# Patient Record
Sex: Male | Born: 1965 | Race: White | Hispanic: No | Smoking: Former smoker
Health system: Southern US, Community
[De-identification: ages and names within clinical notes are randomized; demographics above are authoritative.]

## PROBLEM LIST (undated history)

## (undated) DIAGNOSIS — K219 Gastro-esophageal reflux disease without esophagitis: Secondary | ICD-10-CM

## (undated) DIAGNOSIS — M109 Gout, unspecified: Secondary | ICD-10-CM

## (undated) DIAGNOSIS — M545 Low back pain, unspecified: Secondary | ICD-10-CM

## (undated) DIAGNOSIS — G629 Polyneuropathy, unspecified: Secondary | ICD-10-CM

## (undated) DIAGNOSIS — R112 Nausea with vomiting, unspecified: Secondary | ICD-10-CM

## (undated) DIAGNOSIS — M5116 Intervertebral disc disorders with radiculopathy, lumbar region: Secondary | ICD-10-CM

## (undated) DIAGNOSIS — I10 Essential (primary) hypertension: Secondary | ICD-10-CM

## (undated) DIAGNOSIS — C642 Malignant neoplasm of left kidney, except renal pelvis: Secondary | ICD-10-CM

## (undated) DIAGNOSIS — E785 Hyperlipidemia, unspecified: Secondary | ICD-10-CM

## (undated) DIAGNOSIS — Z9889 Other specified postprocedural states: Secondary | ICD-10-CM

## (undated) DIAGNOSIS — F1010 Alcohol abuse, uncomplicated: Secondary | ICD-10-CM

## (undated) DIAGNOSIS — K769 Liver disease, unspecified: Secondary | ICD-10-CM

## (undated) HISTORY — DX: Malignant neoplasm of left kidney, except renal pelvis: C64.2

## (undated) HISTORY — PX: TONSILLECTOMY: SUR1361

## (undated) HISTORY — DX: Essential (primary) hypertension: I10

## (undated) HISTORY — DX: Intervertebral disc disorders with radiculopathy, lumbar region: M51.16

## (undated) HISTORY — DX: Gastro-esophageal reflux disease without esophagitis: K21.9

---

## 1994-04-22 HISTORY — PX: OTHER SURGICAL HISTORY: SHX169

## 2011-06-21 ENCOUNTER — Ambulatory Visit: Payer: Self-pay | Admitting: Internal Medicine

## 2011-07-17 ENCOUNTER — Inpatient Hospital Stay: Payer: Self-pay | Admitting: Specialist

## 2011-07-17 LAB — CBC
HCT: 40.8 % (ref 40.0–52.0)
HCT: 38.1 % — ABNORMAL LOW (ref 40.0–52.0)
MCH: 30.7 pg (ref 26.0–34.0)
MCV: 90 fL (ref 80–100)
Platelet: 39 10*3/uL — ABNORMAL LOW (ref 150–440)
Platelet: 33 10*3/uL — ABNORMAL LOW (ref 150–440)
RBC: 4.53 10*6/uL (ref 4.40–5.90)
WBC: 1.7 10*3/uL — CL (ref 3.8–10.6)

## 2011-07-17 LAB — URINALYSIS, COMPLETE
Bilirubin,UR: NEGATIVE
Leukocyte Esterase: NEGATIVE
Protein: 100
RBC,UR: <1 /HPF (ref 0–5)
Specific Gravity: 1.03 (ref 1.003–1.030)
WBC UR: 2 /HPF (ref 0–5)

## 2011-07-17 LAB — COMPREHENSIVE METABOLIC PANEL
Albumin: 4.6 g/dL (ref 3.4–5.0)
Alkaline Phosphatase: 81 U/L (ref 50–136)
BUN: 9 mg/dL (ref 7–18)
Calcium, Total: 9.4 mg/dL (ref 8.5–10.1)
Co2: 24 mmol/L (ref 21–32)
EGFR (Non-African Amer.): >60
Glucose: 105 mg/dL — ABNORMAL HIGH (ref 65–99)
Sodium: 138 mmol/L (ref 136–145)

## 2011-07-17 LAB — DIFFERENTIAL
Lymphocyte %: 29.5 %
Monocyte #: 0.2 10*3/uL (ref 0.0–0.7)
Neutrophil #: 1.2 10*3/uL — ABNORMAL LOW (ref 1.4–6.5)
Neutrophil %: 56.6 %

## 2011-07-17 LAB — ETHANOL: Ethanol: 266 mg/dL

## 2011-07-17 LAB — PROTIME-INR: Prothrombin Time: 13.3 secs (ref 11.5–14.7)

## 2011-07-18 LAB — CBC WITH DIFFERENTIAL/PLATELET
HGB: 13.6 g/dL (ref 13.0–18.0)
Lymphocyte #: 0.4 10*3/uL — ABNORMAL LOW (ref 1.0–3.6)
MCV: 91 fL (ref 80–100)
Monocyte #: 0.2 10*3/uL (ref 0.0–0.7)
Monocyte %: 10.2 %
RBC: 4.41 10*6/uL (ref 4.40–5.90)
RDW: 17.9 % — ABNORMAL HIGH (ref 11.5–14.5)
WBC: 2.2 10*3/uL — ABNORMAL LOW (ref 3.8–10.6)

## 2011-07-19 LAB — CBC WITH DIFFERENTIAL/PLATELET
Basophil %: 0 %
Eosinophil %: 0.7 %
Lymphocyte #: 0.2 10*3/uL — ABNORMAL LOW (ref 1.0–3.6)
MCV: 91 fL (ref 80–100)
Monocyte #: 0.2 10*3/uL (ref 0.0–0.7)
Monocyte %: 9.1 %
Neutrophil %: 81.1 %
RBC: 4.69 10*6/uL (ref 4.40–5.90)

## 2011-07-19 LAB — BASIC METABOLIC PANEL
BUN: 4 mg/dL — ABNORMAL LOW (ref 7–18)
Calcium, Total: 9.3 mg/dL (ref 8.5–10.1)
Co2: 25 mmol/L (ref 21–32)
Creatinine: 0.58 mg/dL — ABNORMAL LOW (ref 0.60–1.30)
EGFR (African American): >60
Glucose: 132 mg/dL — ABNORMAL HIGH (ref 65–99)

## 2011-07-19 LAB — MAGNESIUM: Magnesium: 1.3 mg/dL — ABNORMAL LOW

## 2011-07-19 LAB — PLATELET COUNT: Platelet: 45 10*3/uL — ABNORMAL LOW (ref 150–440)

## 2011-07-19 LAB — FOLATE: Folic Acid: 16.2 ng/mL (ref 3.1–100.0)

## 2011-07-20 LAB — CBC WITH DIFFERENTIAL/PLATELET
Basophil #: 0 x10 3/mm 3
Basophil %: 1.3 %
Eosinophil #: 0.1 x10 3/mm 3
Eosinophil %: 4.3 %
HCT: 39.2 % — ABNORMAL LOW
HGB: 13.3 g/dL
Lymphocyte %: 24.4 %
Lymphs Abs: 0.6 x10 3/mm 3 — ABNORMAL LOW
MCH: 31 pg
MCHC: 34.1 g/dL
MCV: 91 fL
Monocyte #: 0.3 x10 3/mm 3
Monocyte %: 11.5 %
Neutrophil #: 1.4 x10 3/mm 3
Neutrophil %: 58.5 %
Platelet: 43 x10 3/mm 3 — ABNORMAL LOW
RBC: 4.3 x10 6/mm 3 — ABNORMAL LOW
RDW: 18 % — ABNORMAL HIGH
WBC: 2.4 x10 3/mm 3 — ABNORMAL LOW

## 2011-07-21 LAB — CBC WITH DIFFERENTIAL/PLATELET
Basophil %: 1.4 %
Eosinophil #: 0.2 10*3/uL (ref 0.0–0.7)
Eosinophil %: 5.5 %
HCT: 40.8 % (ref 40.0–52.0)
Monocyte #: 0.5 10*3/uL (ref 0.0–0.7)
Neutrophil #: 2.3 10*3/uL (ref 1.4–6.5)
Platelet: 79 10*3/uL — ABNORMAL LOW (ref 150–440)
RBC: 4.4 10*6/uL (ref 4.40–5.90)
WBC: 3.8 10*3/uL (ref 3.8–10.6)

## 2011-08-21 ENCOUNTER — Ambulatory Visit: Payer: Self-pay | Admitting: Internal Medicine

## 2011-11-15 ENCOUNTER — Encounter (HOSPITAL_COMMUNITY): Payer: Self-pay | Admitting: Emergency Medicine

## 2011-11-15 ENCOUNTER — Emergency Department (HOSPITAL_COMMUNITY)
Admission: EM | Admit: 2011-11-15 | Discharge: 2011-11-15 | Disposition: A | Discharge status: Home / Self Care | Payer: Medicaid Other | Attending: Emergency Medicine | Admitting: Emergency Medicine

## 2011-11-15 DIAGNOSIS — Z87891 Personal history of nicotine dependence: Secondary | ICD-10-CM | POA: Insufficient documentation

## 2011-11-15 DIAGNOSIS — R000 Tachycardia, unspecified: Secondary | ICD-10-CM | POA: Insufficient documentation

## 2011-11-15 DIAGNOSIS — E876 Hypokalemia: Secondary | ICD-10-CM

## 2011-11-15 DIAGNOSIS — H6120 Impacted cerumen, unspecified ear: Secondary | ICD-10-CM | POA: Insufficient documentation

## 2011-11-15 DIAGNOSIS — I10 Essential (primary) hypertension: Secondary | ICD-10-CM | POA: Insufficient documentation

## 2011-11-15 DIAGNOSIS — K769 Liver disease, unspecified: Secondary | ICD-10-CM

## 2011-11-15 HISTORY — DX: Essential (primary) hypertension: I10

## 2011-11-15 LAB — COMPREHENSIVE METABOLIC PANEL
ALT: 156 U/L — ABNORMAL HIGH (ref 0–53)
AST: 149 U/L — ABNORMAL HIGH (ref 0–37)
Albumin: 4.5 g/dL (ref 3.5–5.2)
Calcium: 10.9 mg/dL — ABNORMAL HIGH (ref 8.4–10.5)
Sodium: 128 mEq/L — ABNORMAL LOW (ref 135–145)
Total Protein: 8.3 g/dL (ref 6.0–8.3)

## 2011-11-15 LAB — CBC WITH DIFFERENTIAL/PLATELET
Basophils Absolute: 0 10*3/uL (ref 0.0–0.1)
Eosinophils Absolute: 0 10*3/uL (ref 0.0–0.7)
Hemoglobin: 16.1 g/dL (ref 13.0–17.0)
Lymphocytes Relative: 17 % (ref 12–46)
MCHC: 36 g/dL (ref 30.0–36.0)
Monocytes Relative: 12 % (ref 3–12)
Neutrophils Relative %: 69 % (ref 43–77)
RDW: 14.9 % (ref 11.5–15.5)
Smear Review: DECREASED
WBC: 3.4 10*3/uL — ABNORMAL LOW (ref 4.0–10.5)

## 2011-11-15 MED ORDER — POTASSIUM CHLORIDE CRYS ER 20 MEQ PO TBCR
40.0000 meq | EXTENDED_RELEASE_TABLET | Freq: Once | ORAL | Status: AC
  Administered 2011-11-15: 40 meq via ORAL
  Filled 2011-11-15: qty 2

## 2011-11-15 MED ORDER — METOPROLOL TARTRATE 50 MG PO TABS
ORAL_TABLET | ORAL | Status: AC
  Administered 2011-11-15: 50 mg
  Filled 2011-11-15: qty 1

## 2011-11-15 MED ORDER — POTASSIUM CHLORIDE ER 10 MEQ PO TBCR: EXTENDED_RELEASE_TABLET | ORAL | Status: DC

## 2011-11-15 MED ORDER — SODIUM CHLORIDE 0.9 % IV SOLN
INTRAVENOUS | Status: DC
  Administered 2011-11-15: 02:00:00 via INTRAVENOUS

## 2011-11-15 MED ORDER — SPIRONOLACTONE 25 MG PO TABS: 25.0000 mg | ORAL_TABLET | Freq: Every day | ORAL | Status: DC

## 2011-11-15 MED ORDER — NEOMYCIN-POLYMYXIN-HC 3.5-10000-1 OT SOLN
OTIC | Status: AC
  Administered 2011-11-15: 3 [drp] via OTIC
  Filled 2011-11-15: qty 10

## 2011-11-15 MED ORDER — BENAZEPRIL HCL 20 MG PO TABS: 10.0000 mg | ORAL_TABLET | Freq: Every day | ORAL | Status: DC

## 2011-11-15 NOTE — ED Notes (Signed)
[facility] aware of altered vital signs, ok to discharge home.

## 2011-11-15 NOTE — ED Provider Notes (Signed)
0210 Patient here with c/o change in hearing. Had cerumen impaction bilaterally. 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 Labs with elevated LFTs. Does not have  PCP. Hypertensive and tachycardia. Last PCP was [facility] who released him. He has been evaluated at both [address] and [facility]. Both have advised he had liver disease and recommended stopping alcohol.  HEENT: slight icterus of conjunctiva which are injected. Cor: Tachycardia, regular rhythm, no murmur, click or rub noted Lungs: clear all fields, no rales, rhonchi, wheezing Abd: Slightly large liver, non tender, no fluid wave Ext: no edema.  Plan of care is to begin antihypertensive, Rx for potassium, Rx for spironolactone. Encouraged to find a PCP. Encouraged to stop drinking.  Nicoletta Dress. Colon Branch, MD 11/15/11 [phone]

## 2011-11-15 NOTE — ED Notes (Addendum)
Patient states that he had vomiting several days ago and has had sensitive hearing since then.  Pt is very sensitive to the lightest sound and jumps at every motion in exam room.  Severe tremor noted. Pt states that he does not drink everyday or use any type of illegal drugs.  Jaundice noted to bilateral eyes.

## 2011-11-15 NOTE — ED Notes (Addendum)
When asked if the patient was in pain, he initially states "no" and then states "pain? Oh yes, in my back and in my legs and numbness in my feet."  Patient does not currently see a doctor.  States it has been several months since he has seen a doctor for anything.  Pt states he is supposed to take several medications but does not do so, because he is unable to afford them at this time.  States he has been off his medications since May 2013.   Patient states he doesn't know what he has been diagnosed with in the past except for high blood pressure.  Pt reassured and advised to seek resources to help him pay for his medications and medical bills.

## 2011-11-15 NOTE — ED Provider Notes (Signed)
Medical screening examination/treatment/procedure(s) were conducted as a shared visit with non-physician practitioner(s) and myself.  I personally evaluated the patient during the encounter  Juan Jackson. Colon Branch, MD 11/15/11 [phone]

## 2011-11-15 NOTE — ED Notes (Signed)
States ears are feeling somewhat better.

## 2011-11-15 NOTE — ED Notes (Signed)
Patient states had some nausea, vomiting on Wednesday and now c/o bilateral ear pain.  States everything sounds loud and he states he can hear muffled sounds when he talks.

## 2011-11-15 NOTE — ED Provider Notes (Signed)
History     CSN: [id number]  Arrival date & time 11/15/11  0006   First MD Initiated Contact with Patient 11/15/11 0022      Chief Complaint  Patient presents with  . Otalgia    (Consider location/radiation/quality/duration/timing/severity/associated sxs/prior treatment) HPI Comments: Patient is a 46 year old male who presents to the emergency department with complaint of pain in both ears. The patient states that he became nauseated and then had 2-3 episodes of vomiting on Wednesday, July 24. He states that following this episode he began to have problems hearing and having some pain in both ears. This is gotten worse today, as he feels as though he hears everything as a" muffled voice or muffled sound". He states he has not had any injury to the ears. He has not been exposed to any excessively loud sounds. He's not had any previous operations or procedures on the ears. He has not been doing any swimming. He has only taken BuSpar during the day today  The history is provided by the patient.    Past Medical History  Diagnosis Date  . Hypertension     History reviewed. No pertinent past surgical history.  No family history on file.  History  Substance Use Topics  . Smoking status: Former Games developer  . Smokeless tobacco: Not on file  . Alcohol Use: Yes     occ      Review of Systems  Constitutional: Negative for activity change.       All ROS Neg except as noted in HPI  HENT: Negative for nosebleeds and neck pain.   Eyes: Negative for photophobia and discharge.  Respiratory: Negative for cough, shortness of breath and wheezing.   Cardiovascular: Negative for chest pain and palpitations.  Gastrointestinal: Positive for abdominal pain. Negative for blood in stool.  Genitourinary: Negative for dysuria, frequency and hematuria.  Musculoskeletal: Negative for back pain and arthralgias.  Skin: Negative.   Neurological: Negative for dizziness, seizures and speech difficulty.    Psychiatric/Behavioral: Negative for hallucinations and confusion. The patient is nervous/anxious.     Allergies  Review of patient's allergies indicates no known allergies.  Home Medications  No current outpatient prescriptions on file.  BP 188/126  Pulse 140  Temp 98.2 F (36.8 C) (Oral)  Ht 6\' 2"  (1.88 m)  Wt 205 lb (92.987 kg)  BMI 26.32 kg/m2  SpO2 98%  Physical Exam  Nursing note and vitals reviewed. Constitutional: He is oriented to person, place, and time. He appears well-developed and well-nourished.  Non-toxic appearance.  HENT:  Head: Normocephalic.  Right Ear: Tympanic membrane and external ear normal.  Left Ear: Tympanic membrane and external ear normal.       There are bilateral cerumen impactions present. Nasal congestion.  Eyes: EOM and lids are normal. Pupils are equal, round, and reactive to light.       There is increased redness and some icterus of the conjunctiva.  Neck: Normal range of motion. Neck supple. Carotid bruit is not present.  Cardiovascular: Regular rhythm, normal heart sounds, intact distal pulses and normal pulses.  Tachycardia present.   Pulmonary/Chest: Breath sounds normal. No respiratory distress.  Abdominal: Soft. Bowel sounds are normal. There is tenderness. There is no guarding.       There is right upper quadrant tenderness to palpation. Patient states this is not new. States" my liver is messed up".  Musculoskeletal: Normal range of motion.  Lymphadenopathy:       Head (right side): No submandibular  adenopathy present.       Head (left side): No submandibular adenopathy present.    He has no cervical adenopathy.  Neurological: He is alert and oriented to person, place, and time. He has normal strength. No cranial nerve deficit or sensory deficit.  Skin: Skin is warm and dry.  Psychiatric: His speech is normal. His mood appears anxious.    ED Course  Procedures (including critical care time)Bilat cerumen impaction partially  removed with warm tap water. Accidentally scratched the left EAC during procedure. Ear wick placed in the left ear. Cortisporin otic 3 gtts placed in right and left ear.   Labs Reviewed  CBC WITH DIFFERENTIAL  COMPREHENSIVE METABOLIC PANEL  AMMONIA  EKG 0:32 - rate-120 beats per minute. Rhythm-sinus tachycardia. PR interval within normal limits. Axis-normal. QRS-within normal limits. ST-within normal limits.No STEMI. No previous EKG to compare. No results found.   No diagnosis found.    MDM  I have reviewed nursing notes, vital signs, and all appropriate lab and imaging results for this patient. Heart rate was up to 138. Down to 120 at 01:23.  Pt admits to drinking Vodka on Friday 7/19 and Sunday 7/21. He has been told not to drink due to liver problems. Ammonia 33 - wnl. The AST is elevated at 149. The ALT is elevated at 156. Total Bili is 2. Potassium is 3.3.  Pt seen with me by Dr Colon Branch. Pt given list of foods high in potassium. Spironolactone and Lotensin given. Pt to call Dr Darrick Penna for assistance wth liver disease. [facility] for assistance with his high blood pressure. Pt advised to stop using ETOH.  Heart rate down to 110. Pt awake and alert in no distress.  Kathie Dike, [place] 11/15/11 [phone]

## 2011-11-15 NOTE — ED Notes (Signed)
Pt seems somewhat calmer, tremor continues in bilateral hands.

## 2011-11-18 ENCOUNTER — Encounter (HOSPITAL_COMMUNITY): Payer: Self-pay | Admitting: *Deleted

## 2011-11-18 ENCOUNTER — Emergency Department (HOSPITAL_COMMUNITY)
Admission: EM | Admit: 2011-11-18 | Discharge: 2011-11-18 | Disposition: A | Discharge status: Home / Self Care | Payer: Medicaid Other | Attending: Emergency Medicine | Admitting: Emergency Medicine

## 2011-11-18 DIAGNOSIS — M109 Gout, unspecified: Secondary | ICD-10-CM | POA: Insufficient documentation

## 2011-11-18 DIAGNOSIS — R319 Hematuria, unspecified: Secondary | ICD-10-CM | POA: Insufficient documentation

## 2011-11-18 DIAGNOSIS — R748 Abnormal levels of other serum enzymes: Secondary | ICD-10-CM

## 2011-11-18 DIAGNOSIS — F1010 Alcohol abuse, uncomplicated: Secondary | ICD-10-CM | POA: Insufficient documentation

## 2011-11-18 DIAGNOSIS — I10 Essential (primary) hypertension: Secondary | ICD-10-CM | POA: Insufficient documentation

## 2011-11-18 DIAGNOSIS — D696 Thrombocytopenia, unspecified: Secondary | ICD-10-CM

## 2011-11-18 DIAGNOSIS — Z87891 Personal history of nicotine dependence: Secondary | ICD-10-CM | POA: Insufficient documentation

## 2011-11-18 HISTORY — DX: Gout, unspecified: M10.9

## 2011-11-18 LAB — URINALYSIS, ROUTINE W REFLEX MICROSCOPIC
Glucose, UA: NEGATIVE mg/dL
Leukocytes, UA: NEGATIVE
Specific Gravity, Urine: 1.025 (ref 1.005–1.030)
pH: 6 (ref 5.0–8.0)

## 2011-11-18 LAB — HEPATIC FUNCTION PANEL
ALT: 98 U/L — ABNORMAL HIGH (ref 0–53)
AST: 63 U/L — ABNORMAL HIGH (ref 0–37)
Albumin: 3.9 g/dL (ref 3.5–5.2)
Alkaline Phosphatase: 57 U/L (ref 39–117)
Total Bilirubin: 1.7 mg/dL — ABNORMAL HIGH (ref 0.3–1.2)

## 2011-11-18 LAB — BASIC METABOLIC PANEL
CO2: 26 mEq/L (ref 19–32)
Calcium: 10.3 mg/dL (ref 8.4–10.5)
Chloride: 96 mEq/L (ref 96–112)
Sodium: 134 mEq/L — ABNORMAL LOW (ref 135–145)

## 2011-11-18 LAB — URIC ACID: Uric Acid, Serum: 6.7 mg/dL (ref 4.0–7.8)

## 2011-11-18 LAB — URINE MICROSCOPIC-ADD ON

## 2011-11-18 LAB — CBC WITH DIFFERENTIAL/PLATELET
Basophils Absolute: 0 10*3/uL (ref 0.0–0.1)
HCT: 38.9 % — ABNORMAL LOW (ref 39.0–52.0)
Lymphocytes Relative: 14 % (ref 12–46)
Monocytes Relative: 27 % — ABNORMAL HIGH (ref 3–12)
Neutro Abs: 2.8 10*3/uL (ref 1.7–7.7)
RDW: 15.4 % (ref 11.5–15.5)
WBC: 4.8 10*3/uL (ref 4.0–10.5)

## 2011-11-18 MED ORDER — KETOROLAC TROMETHAMINE 60 MG/2ML IM SOLN
60.0000 mg | Freq: Once | INTRAMUSCULAR | Status: AC
  Administered 2011-11-18: 60 mg via INTRAMUSCULAR
  Filled 2011-11-18: qty 2

## 2011-11-18 MED ORDER — INDOMETHACIN 50 MG PO CAPS: ORAL_CAPSULE | ORAL | Status: DC

## 2011-11-18 MED ORDER — HYDROCODONE-ACETAMINOPHEN 5-325 MG PO TABS: 2.0000 | ORAL_TABLET | Freq: Four times a day (QID) | ORAL | Status: AC | PRN

## 2011-11-18 MED ORDER — ATENOLOL 50 MG PO TABS: 50.0000 mg | ORAL_TABLET | Freq: Every day | ORAL | Status: DC

## 2011-11-18 NOTE — ED Notes (Signed)
Pt alert and oriented x 3. Skin warm and dry. Color pink. Breath sounds clear and equal bilaterally. Pt has redness and swelling to his left great toe and ankle. Pt states that the B/P meds have caused his gout to flare up. Pt also c/o hematuria since this am. Denies abdominal or flank pain. Denies problems with urination.

## 2011-11-18 NOTE — ED Provider Notes (Cosign Needed)
History  This chart was scribed for Ward Givens, MD by Bennett Scrape. This patient was seen in room APA05/APA05 and the patient's care was started at 2:04PM.  CSN: [id number]  Arrival date & time 11/18/11  1322   First MD Initiated Contact with Patient 11/18/11 1404      Chief Complaint  Patient presents with  . Hematuria  . Gout    Patient is a 46 y.o. male presenting with hematuria. The history is provided by the patient. No language interpreter was used.  Hematuria This is a new problem. The current episode started today. The problem is unchanged. The hematuria occurs throughout his entire urinary stream. He reports no clotting in his urine stream. He describes his urine color as tea colored. Irritative symptoms do not include frequency or urgency. Pertinent negatives include no abdominal pain, chills, dysuria, fever, flank pain, nausea or vomiting.    Juan Jackson is a 46 y.o. male who presents to the Emergency Department complaining of 12 hours of sudden onset, constant hematuria described as tea colored. Pt reports two episodes today. He reports prior episodes attributed to a non-specific liver problem which he states he needed blood transfusions and platelet transfusions for. He also c/o gout in his left foot attributed to benazepril and spironolactone that he was started on 4 days ago. He reports that he has a h/o gout but has not had a flare up in 2 years. He states that he hasn't been taking his gout or his HTN medications for 2 years after his PCP stopped them 2 years ago due to the HTN medication causing the gout flare ups. He denies abdominal pain, nausea, emesis, fever and new back pain as associated symptoms. He reports that he stopped drinking alcohol one week ago but states that he was consuming up to half a gallon of alcohol per week before that.   Past Medical History  Diagnosis Date  . Hypertension   . Gout-46 years of age     Past Surgical History  Procedure  Date  . Tonsillectomy     No family history on file.  History  Substance Use Topics  . Smoking status: Former Games developer  . Smokeless tobacco: Not on file  . Alcohol Use: No  filing for disability     Review of Systems  Constitutional: Negative for fever and chills.  Gastrointestinal: Negative for nausea, vomiting, abdominal pain and diarrhea.  Genitourinary: Positive for hematuria. Negative for dysuria, urgency, frequency and flank pain.  Musculoskeletal: Positive for back pain (chronic).       Left foot pain and swelling  Neurological: Positive for weakness. Negative for dizziness.  All other systems reviewed and are negative.    Allergies  Review of patient's allergies indicates no known allergies.  Home Medications   Current Outpatient Rx  Name Route Sig Dispense Refill  . BENAZEPRIL HCL 20 MG PO TABS Oral Take 0.5 tablets (10 mg total) by mouth daily. 30 tablet 0  . SPIRONOLACTONE 25 MG PO TABS Oral Take 1 tablet (25 mg total) by mouth daily. 30 tablet 0  . TETRAHYDROZOLINE HCL 0.05 % OP SOLN Both Eyes Place 1 drop into both eyes as needed. For allergy eyes      Triage Vitals: Blood pressure 116/78,Pulse 120  Temp 99 F (37.2 C) (Oral)  Resp 20  Ht 6\' 2"  (1.88 m)  Wt 205 lb (92.987 kg)  BMI 26.32 kg/m2  SpO2 100%  Vital signs normal except tachycardia.  Physical Exam  Nursing note and vitals reviewed. Constitutional: He is oriented to person, place, and time. He appears well-developed and well-nourished. No distress.  HENT:  Head: Normocephalic and atraumatic.  Right Ear: External ear normal.  Left Ear: External ear normal.  Mouth/Throat: Oropharynx is clear and moist.       Mild tongue tremor  Eyes: Conjunctivae and EOM are normal. Pupils are equal, round, and reactive to light. No scleral icterus.  Neck: Normal range of motion. Neck supple. No tracheal deviation present.  Cardiovascular: Regular rhythm.  Tachycardia present.  Exam reveals no gallop  and no friction rub.   No murmur heard. Pulmonary/Chest: Effort normal and breath sounds normal. No respiratory distress.  Abdominal: Soft. There is no tenderness.  Musculoskeletal: Normal range of motion. He exhibits edema and tenderness.       Diffuse swelling and warmth of left foot, erythema over the MTP of the left great toe, erythema over lateral and medial malleolae c/w gout  Neurological: He is alert and oriented to person, place, and time.  Skin: Skin is warm and dry.  Psychiatric: He has a normal mood and affect. His behavior is normal.    ED Course  Procedures (including critical care time)   Medications  ketorolac (TORADOL) injection 60 mg (60 mg Intramuscular Given 11/18/11 1459)    DIAGNOSTIC STUDIES: Oxygen Saturation is 100% on room air, normal by my interpretation.    COORDINATION OF CARE: 2:50PM-Discussed treatment plan which includes blood work, urinalysis and gout medication with pt at bedside and pt agreed to plan.  3:48PM-Pt rechecked and states that symptoms are improved. Informed pt of lab results. Discussed discharge plan with pt and pt agreed.   Results for orders placed during the hospital encounter of 11/18/11  URINALYSIS, ROUTINE W REFLEX MICROSCOPIC      Component Value Range   Color, Urine YELLOW  YELLOW   APPearance CLEAR  CLEAR   Specific Gravity, Urine 1.025  1.005 - 1.030   pH 6.0  5.0 - 8.0   Glucose, UA NEGATIVE  NEGATIVE mg/dL   Hgb urine dipstick NEGATIVE  NEGATIVE   Bilirubin Urine MODERATE (*) NEGATIVE   Ketones, ur NEGATIVE  NEGATIVE mg/dL   Protein, ur TRACE (*) NEGATIVE mg/dL   Urobilinogen, UA 0.2  0.0 - 1.0 mg/dL   Nitrite POSITIVE (*) NEGATIVE   Leukocytes, UA NEGATIVE  NEGATIVE  BASIC METABOLIC PANEL      Component Value Range   Sodium 134 (*) 135 - 145 mEq/L   Potassium 3.8  3.5 - 5.1 mEq/L   Chloride 96  96 - 112 mEq/L   CO2 26  19 - 32 mEq/L   Glucose, Bld 103 (*) 70 - 99 mg/dL   BUN 13  6 - 23 mg/dL   Creatinine,  Ser 1.61  0.50 - 1.35 mg/dL   Calcium 09.6  8.4 - 04.5 mg/dL   GFR calc non Af Amer >90  >90 mL/min   GFR calc Af Amer >90  >90 mL/min  URIC ACID      Component Value Range   Uric Acid, Serum 6.7  4.0 - 7.8 mg/dL  CBC WITH DIFFERENTIAL      Component Value Range   WBC 4.8  4.0 - 10.5 K/uL   RBC 4.47  4.22 - 5.81 MIL/uL   Hemoglobin 13.5  13.0 - 17.0 g/dL   HCT 40.9 (*) 81.1 - 91.4 %   MCV 87.0  78.0 - 100.0 fL   MCH  30.2  26.0 - 34.0 pg   MCHC 34.7  30.0 - 36.0 g/dL   RDW 16.1  09.6 - 04.5 %   Platelets 83 (*) 150 - 400 K/uL   Neutrophils Relative 58  43 - 77 %   Lymphocytes Relative 14  12 - 46 %   Monocytes Relative 27 (*) 3 - 12 %   Eosinophils Relative 0  0 - 5 %   Basophils Relative 1  0 - 1 %   Neutro Abs 2.8  1.7 - 7.7 K/uL   Lymphs Abs 0.7  0.7 - 4.0 K/uL   Monocytes Absolute 1.3 (*) 0.1 - 1.0 K/uL   Eosinophils Absolute 0.0  0.0 - 0.7 K/uL   Basophils Absolute 0.0  0.0 - 0.1 K/uL   WBC Morphology ATYPICAL LYMPHOCYTES     Smear Review PLATELET COUNT CONFIRMED BY SMEAR    URINE MICROSCOPIC-ADD ON      Component Value Range   Squamous Epithelial / LPF RARE  RARE   WBC, UA 0-2  <3 WBC/hpf   Bacteria, UA FEW (*) RARE   Urine-Other MUCOUS PRESENT    APTT      Component Value Range   aPTT 30  24 - 37 seconds  PROTIME-INR      Component Value Range   Prothrombin Time 13.9  11.6 - 15.2 seconds   INR 1.05  0.00 - 1.49  HEPATIC FUNCTION PANEL      Component Value Range   Total Protein 7.9  6.0 - 8.3 g/dL   Albumin 3.9  3.5 - 5.2 g/dL   AST 63 (*) 0 - 37 U/L   ALT 98 (*) 0 - 53 U/L   Alkaline Phosphatase 57  39 - 117 U/L   Total Bilirubin 1.7 (*) 0.3 - 1.2 mg/dL   Bilirubin, Direct 0.3  0.0 - 0.3 mg/dL   Indirect Bilirubin 1.4 (*) 0.3 - 0.9 mg/dL   Laboratory interpretation all normal except improving elevations of LFT's, improving thrombocytopenia, bilirubinuria   1. Acute gouty arthropathy   2. Alcohol abuse   3. Abnormal liver enzymes   4. Thrombocytopenia      New Prescriptions   ATENOLOL (TENORMIN) 50 MG TABLET    Take 1 tablet (50 mg total) by mouth daily.   HYDROCODONE-ACETAMINOPHEN (NORCO/VICODIN) 5-325 MG PER TABLET    Take 2 tablets by mouth every 6 (six) hours as needed for pain.   INDOMETHACIN (INDOCIN) 50 MG CAPSULE    Take 1 po QID x 3d , then 1 po TID x 3d then 1 po BID x 3d, then 1 po QD x 3d    Plan discharge  Devoria Albe, MD, FACEP   MDM    I personally performed the services described in this documentation, which was scribed in my presence. The recorded information has been reviewed and considered.  Devoria Albe, MD, Armando Gang    Ward Givens, MD 11/18/11 [phone]

## 2011-11-18 NOTE — ED Notes (Signed)
Pt states he was here Thursday night and placed on Benazepril and Spironolactone. Pt states these meds have caused gout to left foot. States prior BP med caused gout once before also. Pt also states he noticed blood in his urine this morning. NAD.

## 2011-12-26 ENCOUNTER — Emergency Department (HOSPITAL_COMMUNITY): Payer: Medicaid Other

## 2011-12-26 ENCOUNTER — Encounter (HOSPITAL_COMMUNITY): Payer: Self-pay | Admitting: *Deleted

## 2011-12-26 ENCOUNTER — Emergency Department (HOSPITAL_COMMUNITY)
Admission: EM | Admit: 2011-12-26 | Discharge: 2011-12-26 | Disposition: A | Discharge status: Home / Self Care | Payer: Medicaid Other | Attending: Emergency Medicine | Admitting: Emergency Medicine

## 2011-12-26 DIAGNOSIS — Z87891 Personal history of nicotine dependence: Secondary | ICD-10-CM | POA: Insufficient documentation

## 2011-12-26 DIAGNOSIS — S52599A Other fractures of lower end of unspecified radius, initial encounter for closed fracture: Secondary | ICD-10-CM | POA: Insufficient documentation

## 2011-12-26 DIAGNOSIS — S52501A Unspecified fracture of the lower end of right radius, initial encounter for closed fracture: Secondary | ICD-10-CM

## 2011-12-26 DIAGNOSIS — Y93H3 Activity, building and construction: Secondary | ICD-10-CM | POA: Insufficient documentation

## 2011-12-26 DIAGNOSIS — K769 Liver disease, unspecified: Secondary | ICD-10-CM | POA: Insufficient documentation

## 2011-12-26 DIAGNOSIS — I10 Essential (primary) hypertension: Secondary | ICD-10-CM | POA: Insufficient documentation

## 2011-12-26 DIAGNOSIS — W138XXA Fall from, out of or through other building or structure, initial encounter: Secondary | ICD-10-CM | POA: Insufficient documentation

## 2011-12-26 DIAGNOSIS — W19XXXA Unspecified fall, initial encounter: Secondary | ICD-10-CM

## 2011-12-26 HISTORY — DX: Low back pain, unspecified: M54.50

## 2011-12-26 HISTORY — DX: Liver disease, unspecified: K76.9

## 2011-12-26 HISTORY — DX: Alcohol abuse, uncomplicated: F10.10

## 2011-12-26 HISTORY — DX: Low back pain: M54.5

## 2011-12-26 HISTORY — DX: Polyneuropathy, unspecified: G62.9

## 2011-12-26 MED ORDER — OXYCODONE-ACETAMINOPHEN 5-325 MG PO TABS
1.0000 | ORAL_TABLET | Freq: Once | ORAL | Status: AC
  Administered 2011-12-26: 1 via ORAL
  Filled 2011-12-26: qty 1

## 2011-12-26 MED ORDER — OXYCODONE-ACETAMINOPHEN 5-325 MG PO TABS: 1.0000 | ORAL_TABLET | ORAL | Status: AC | PRN

## 2011-12-26 NOTE — ED Notes (Signed)
Patient fell of roof approx. 13-14 feet. R arm pain.

## 2011-12-26 NOTE — ED Provider Notes (Signed)
History     CSN: [id number]  Arrival date & time 12/26/11  1343   First MD Initiated Contact with Patient 12/26/11 1402      Chief Complaint  Patient presents with  . Arm Pain    (Consider location/radiation/quality/duration/timing/severity/associated sxs/prior treatment) HPI Comments: Patient is a 46 year old man who was power washing a roof prior to applying a sealer to it. He slipped and fell, falling 14 feet onto concrete. He was not unconscious. There was no bleeding. He notes pain in his right shoulder, right wrist. He therefore came to [facility] ED for evaluation.  Patient is a 46 y.o. male presenting with fall. The history is provided by the patient. No language interpreter was used.  Fall The accident occurred less than 1 hour ago. The fall occurred from a roof. He fell from a height of 11 to 15 ft. He landed on concrete. There was no blood loss. The point of impact was the right shoulder and right wrist. The pain is severe. He was ambulatory at the scene. There was no entrapment after the fall. There was no drug use involved in the accident. There was no alcohol use involved in the accident. Pertinent negatives include no fever, no numbness, no abdominal pain, no nausea, no vomiting and no tingling. The symptoms are aggravated by use of the injured limb. Prehospitalization: Splint to right forearm. He has tried immobilization for the symptoms. The treatment provided mild relief.    Past Medical History  Diagnosis Date  . Hypertension   . Gout   . Liver disease, chronic   . ETOH abuse   . Lumbar back pain   . Peripheral neuropathy     Past Surgical History  Procedure Date  . Tonsillectomy     Family History  Problem Relation Age of Onset  . Cancer Mother   . Diabetes Mother   . Coronary artery disease Mother   . Coronary artery disease Father   . Hypertension Father   . Diabetes Father   . Diabetes Sister   . Diabetes Brother   . Cirrhosis Brother   . Diabetes  Sister   . Thyroid disease Sister     History  Substance Use Topics  . Smoking status: Former Smoker    Quit date: 12/22/1991  . Smokeless tobacco: Not on file  . Alcohol Use: No      Review of Systems  Constitutional: Negative.  Negative for fever and chills.  HENT: Negative.   Eyes: Negative.   Respiratory: Negative.   Cardiovascular: Negative.   Gastrointestinal: Negative.  Negative for nausea, vomiting and abdominal pain.  Genitourinary: Negative.   Musculoskeletal:       Injury to right shoulder and right forearm.  Neurological: Negative.  Negative for tingling and numbness.  Psychiatric/Behavioral: Negative.     Allergies  Morphine and related  Home Medications   Current Outpatient Rx  Name Route Sig Dispense Refill  . ATENOLOL 50 MG PO TABS Oral Take 1 tablet (50 mg total) by mouth daily. 30 tablet 0  . BENAZEPRIL HCL 20 MG PO TABS Oral Take 0.5 tablets (10 mg total) by mouth daily. 30 tablet 0  . INDOMETHACIN 50 MG PO CAPS  Take 1 po QID x 3d , then 1 po TID x 3d then 1 po BID x 3d, then 1 po QD x 3d 30 capsule 0  . SPIRONOLACTONE 25 MG PO TABS Oral Take 1 tablet (25 mg total) by mouth daily. 30 tablet 0  .  TETRAHYDROZOLINE HCL 0.05 % OP SOLN Both Eyes Place 1 drop into both eyes as needed. For allergy eyes      BP 118/83  Pulse 68  Temp 98 F (36.7 C) (Oral)  Resp 18  Ht 6\' 2"  (1.88 m)  Wt 220 lb (99.791 kg)  BMI 28.25 kg/m2  SpO2 98%  Physical Exam  Nursing note and vitals reviewed. Constitutional: He is oriented to person, place, and time. He appears well-developed and well-nourished.       In moderate distress with pain in the right arm.  HENT:  Head: Normocephalic and atraumatic.  Right Ear: External ear normal.  Left Ear: External ear normal.  Mouth/Throat: Oropharynx is clear and moist.  Eyes: Conjunctivae and EOM are normal. Pupils are equal, round, and reactive to light.  Neck: Normal range of motion. Neck supple.       No deformity of  the neck, no muscle spasm, full range of motion of the neck.  Cardiovascular: Normal rate, regular rhythm and normal heart sounds.   Pulmonary/Chest: Effort normal and breath sounds normal. He exhibits no tenderness.  Abdominal: Soft. Bowel sounds are normal. He exhibits no distension. There is no tenderness.  Musculoskeletal:       He has pain over the rotator cuff of the right arm and over the mid right forearm. He has intact pulses sensation and tendon function in the right hand.  Neurological: He is alert and oriented to person, place, and time.       No sensory or motor deficit.  Skin: Skin is warm and dry.  Psychiatric: He has a normal mood and affect. His behavior is normal.    ED Course  Procedures (including critical care time)  2:14 PM Pt seen --> physical exam performed. PO pain medicine ordered.  X-rays ordered.  4:21 PM X-rays showed comminuted fracture of right distal radius.  Other x-rays were negative.  Case discussed by telephone with Dr. Hilda Lias, orthopedist on call, who advised a sugartong splint, sling, pain medication, and office followup tomorrow.   1. Fall   2. Fracture of right distal radius       Carleene Cooper III, MD 12/26/11 1622

## 2011-12-26 NOTE — ED Notes (Signed)
Patient with no complaints at this time. Respirations even and unlabored. Skin warm/dry. Discharge instructions reviewed with patient at this time. Patient given opportunity to voice concerns/ask questions. Patient discharged at this time and left Emergency Department with steady gait.   

## 2011-12-26 NOTE — ED Notes (Signed)
3" ortho-glass splint placed on patients right arm. Sugar tong splint. With arm sling. Patient tolerated well and voiced no concerns.

## 2013-08-06 IMAGING — CR DG CERVICAL SPINE COMPLETE 4+V
6 series · 6 of 6 positions shown · non-contrast
Comparison: None available.

CLINICAL DATA: Fall.  Arm pain.

CERVICAL SPINE - COMPLETE 4+ VIEW

[view not recorded (1 of 6)]
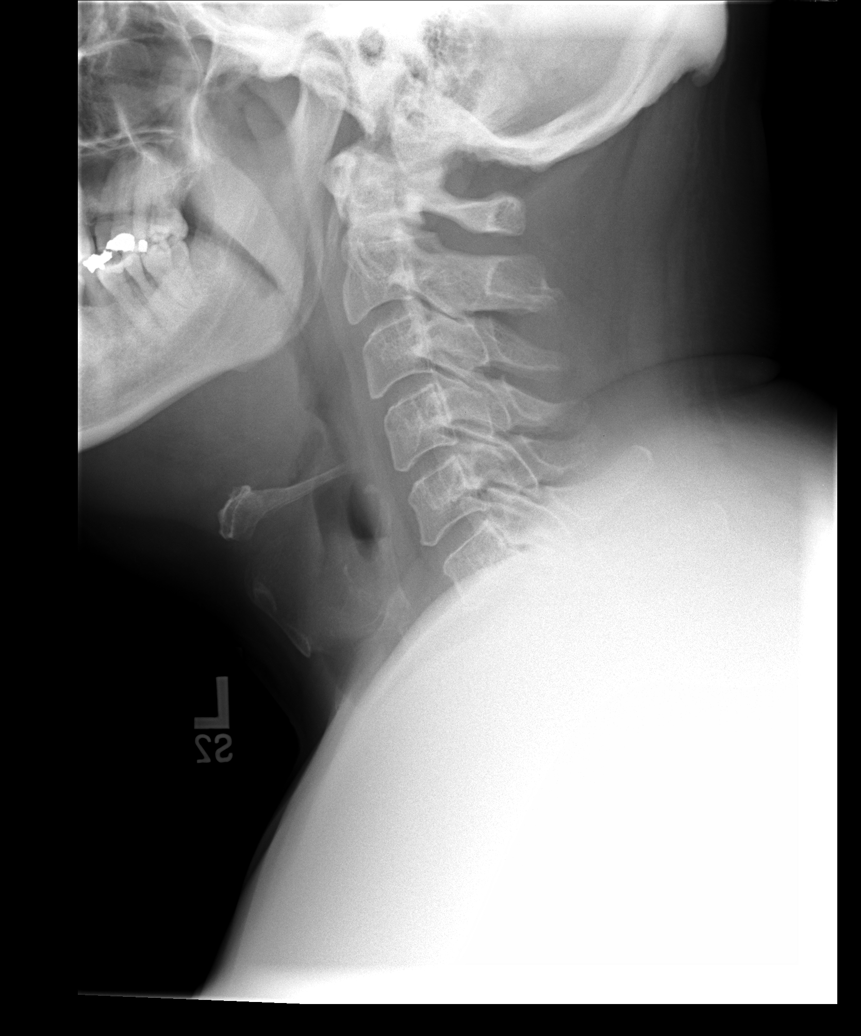

[view not recorded (2 of 6)]
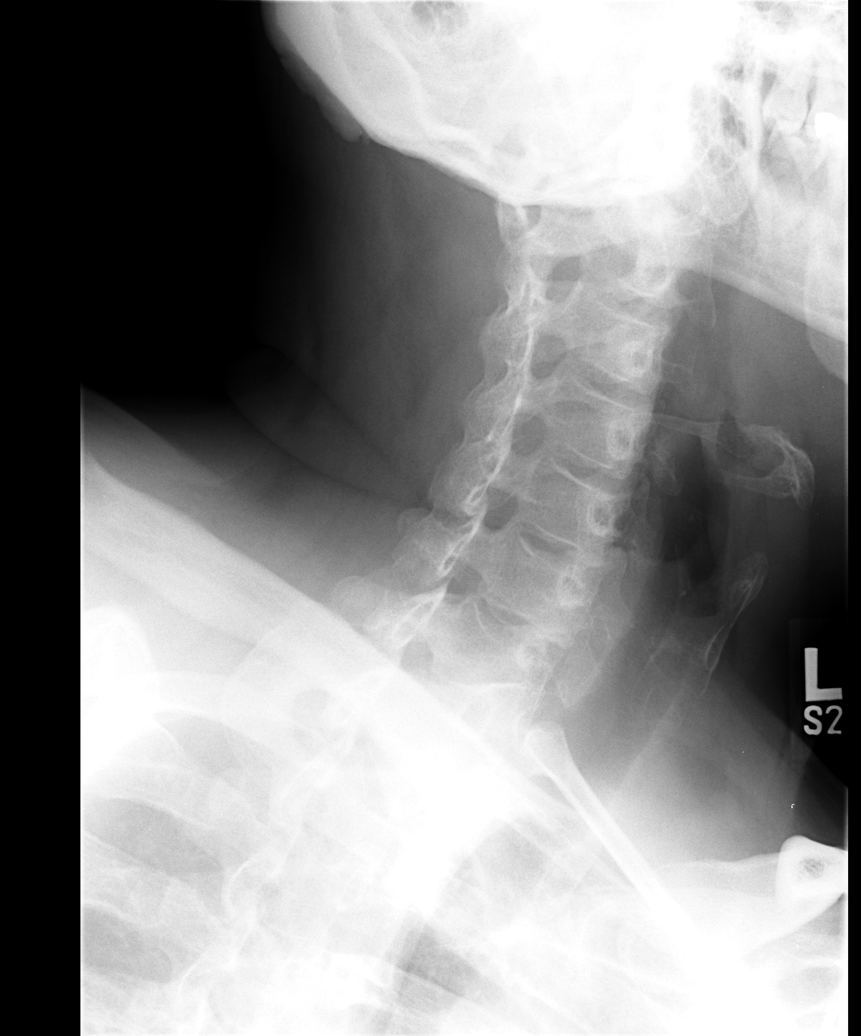

[view not recorded (3 of 6)]
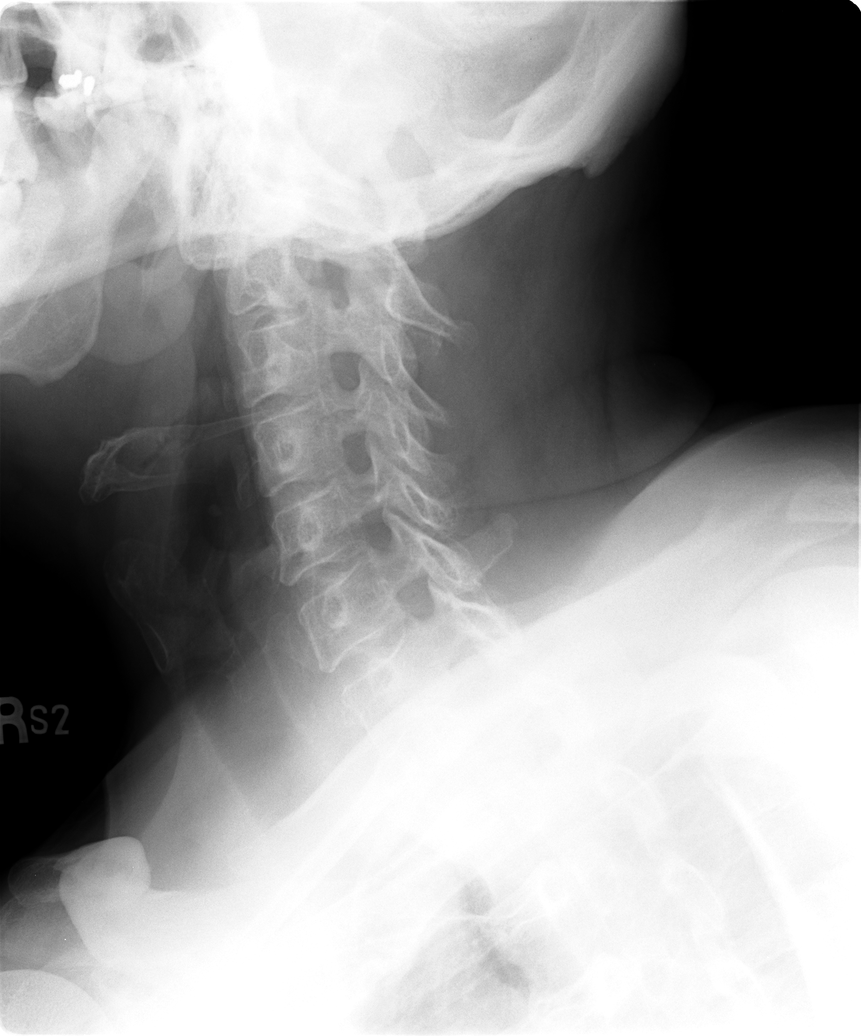

[view not recorded (4 of 6)]
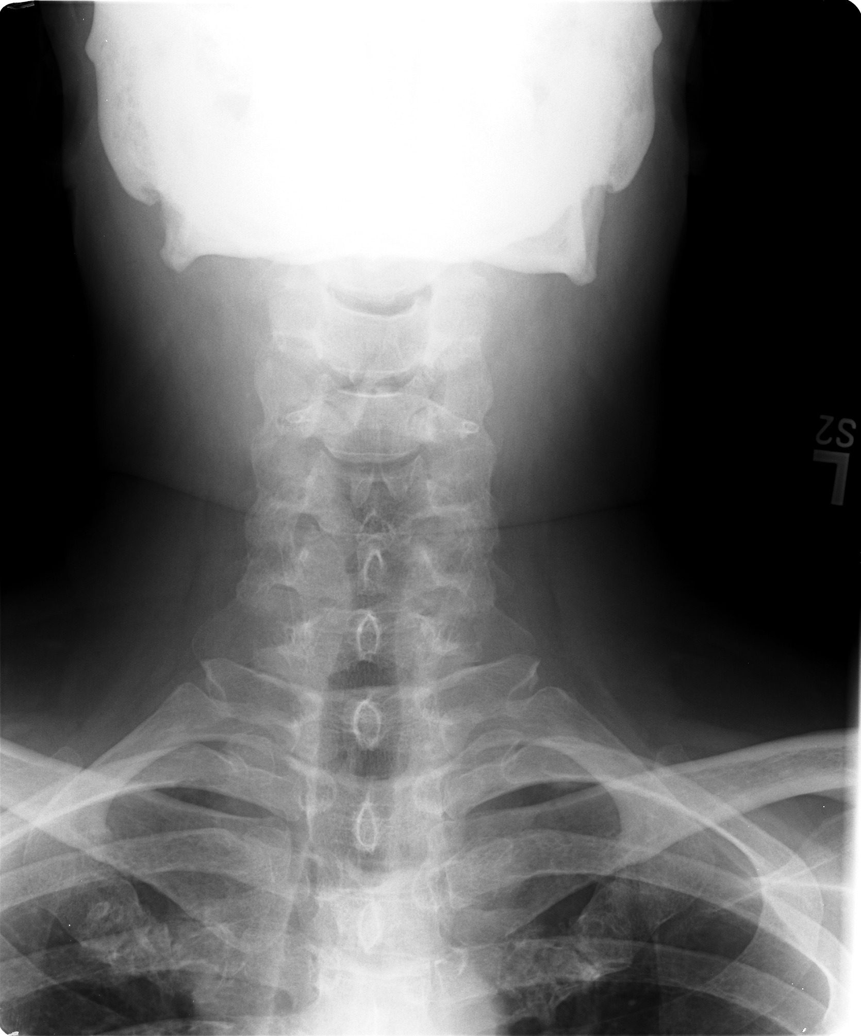

[view not recorded (5 of 6)]
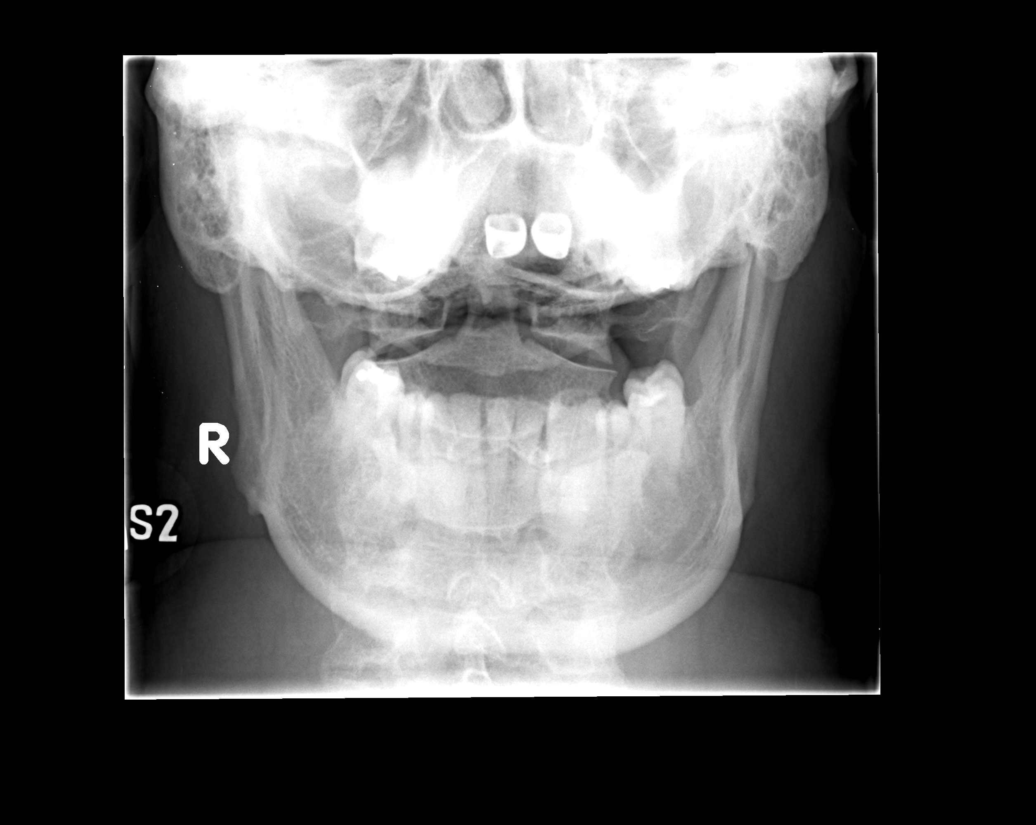

[view not recorded (6 of 6)]
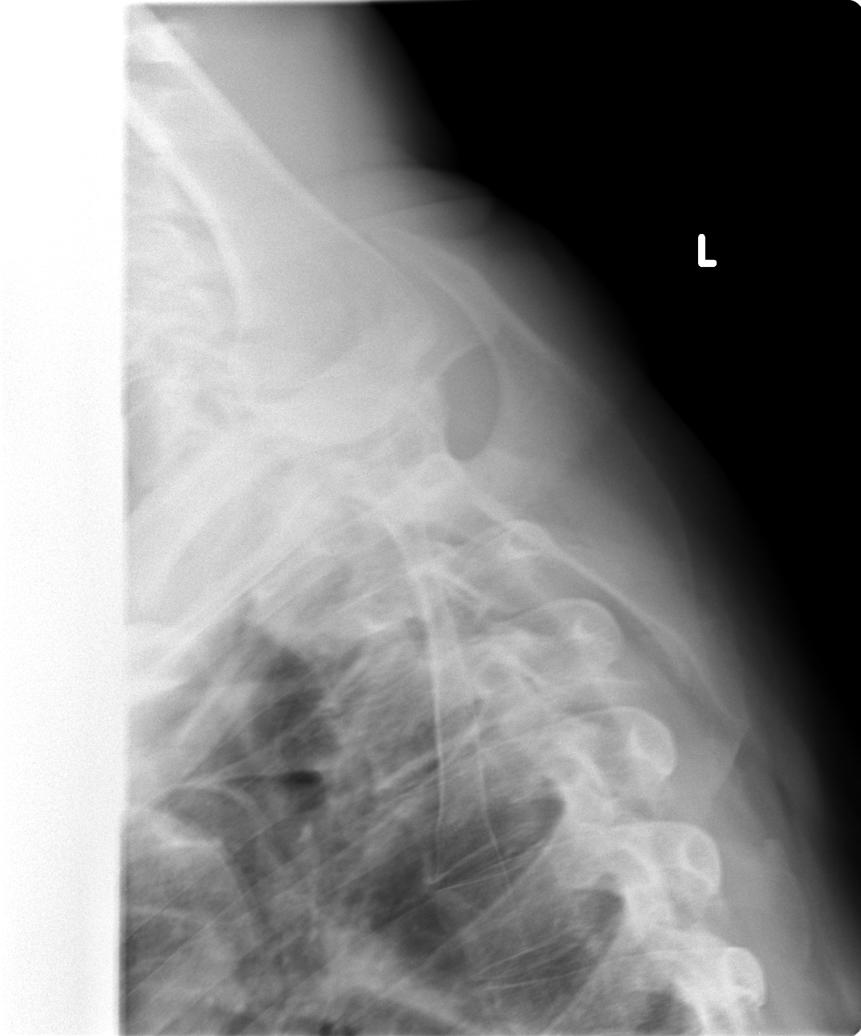

[6 of 6 positions shown; findings below may reference images not displayed]

FINDINGS: The cervical spine is visualized from skull base through
C6.  The prevertebral soft tissues are within normal limits.
Alignment is anatomic.  The C7 the cervicothoracic junction are
incompletely visualized.  There is significant motion on the
swimmer's view.  The AP and oblique views are within normal limits.
The lung apices are clear.
IMPRESSION: 1.  No acute abnormality of the cervical spine.
2.  The cervicothoracic junction is incompletely visualized on the
lateral and swimmer's views.

## 2013-08-06 IMAGING — CR DG WRIST COMPLETE 3+V*R*
3 series · 3 of 3 positions shown · non-contrast
Comparison: None.

CLINICAL DATA: Fall.  Right forearm and wrist pain.

RIGHT WRIST - COMPLETE 3+ VIEW

[view not recorded (1 of 3)]
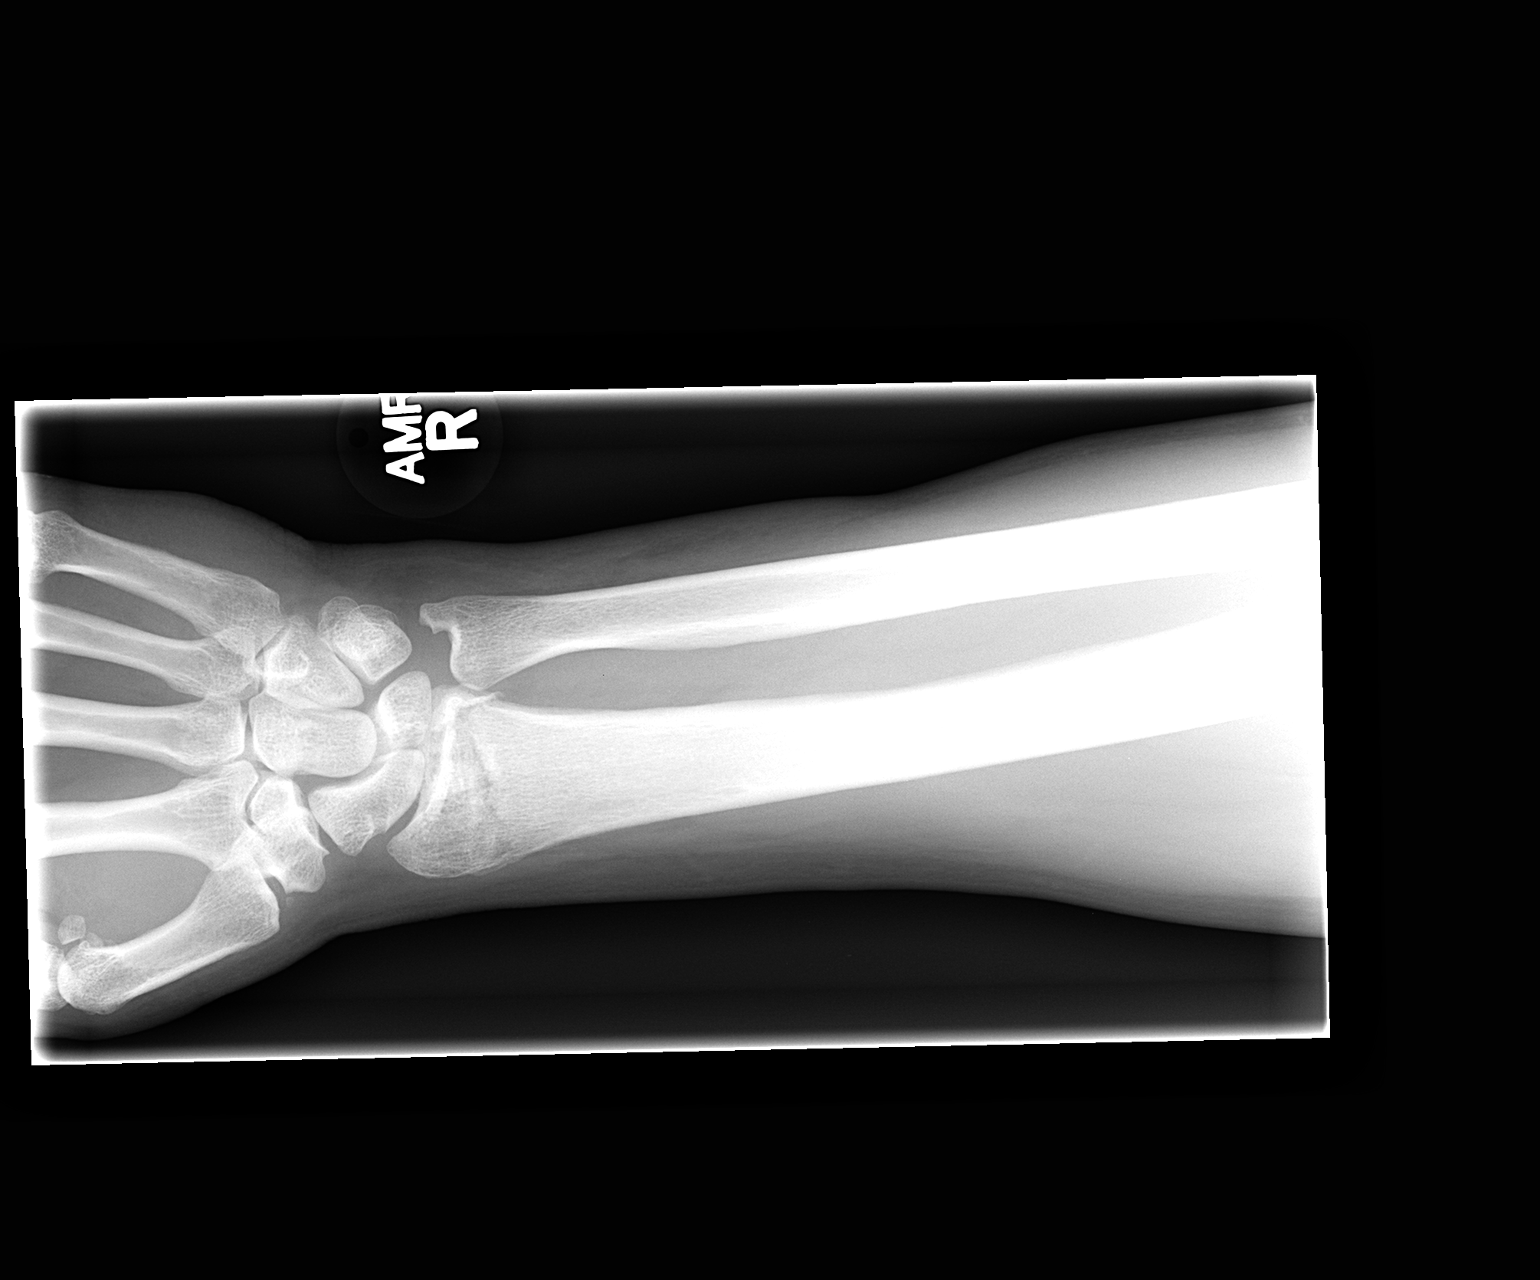

[view not recorded (2 of 3)]
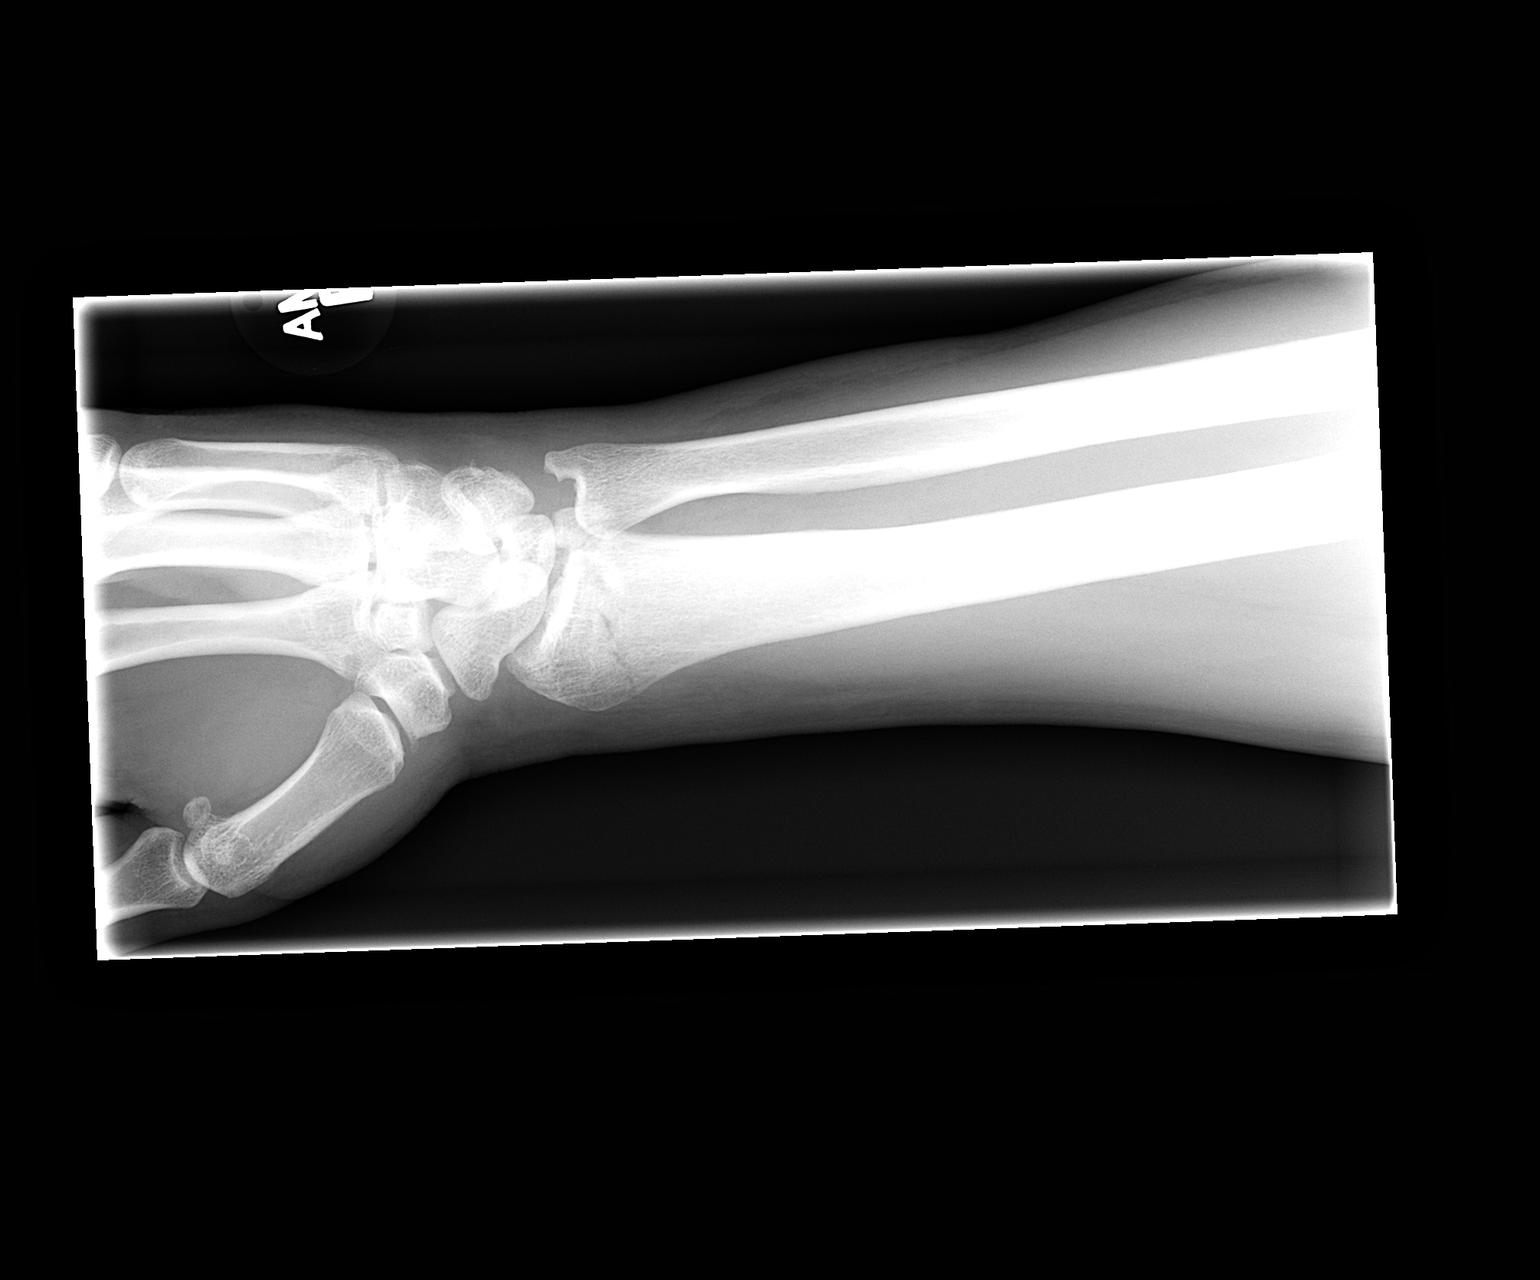

[view not recorded (3 of 3)]
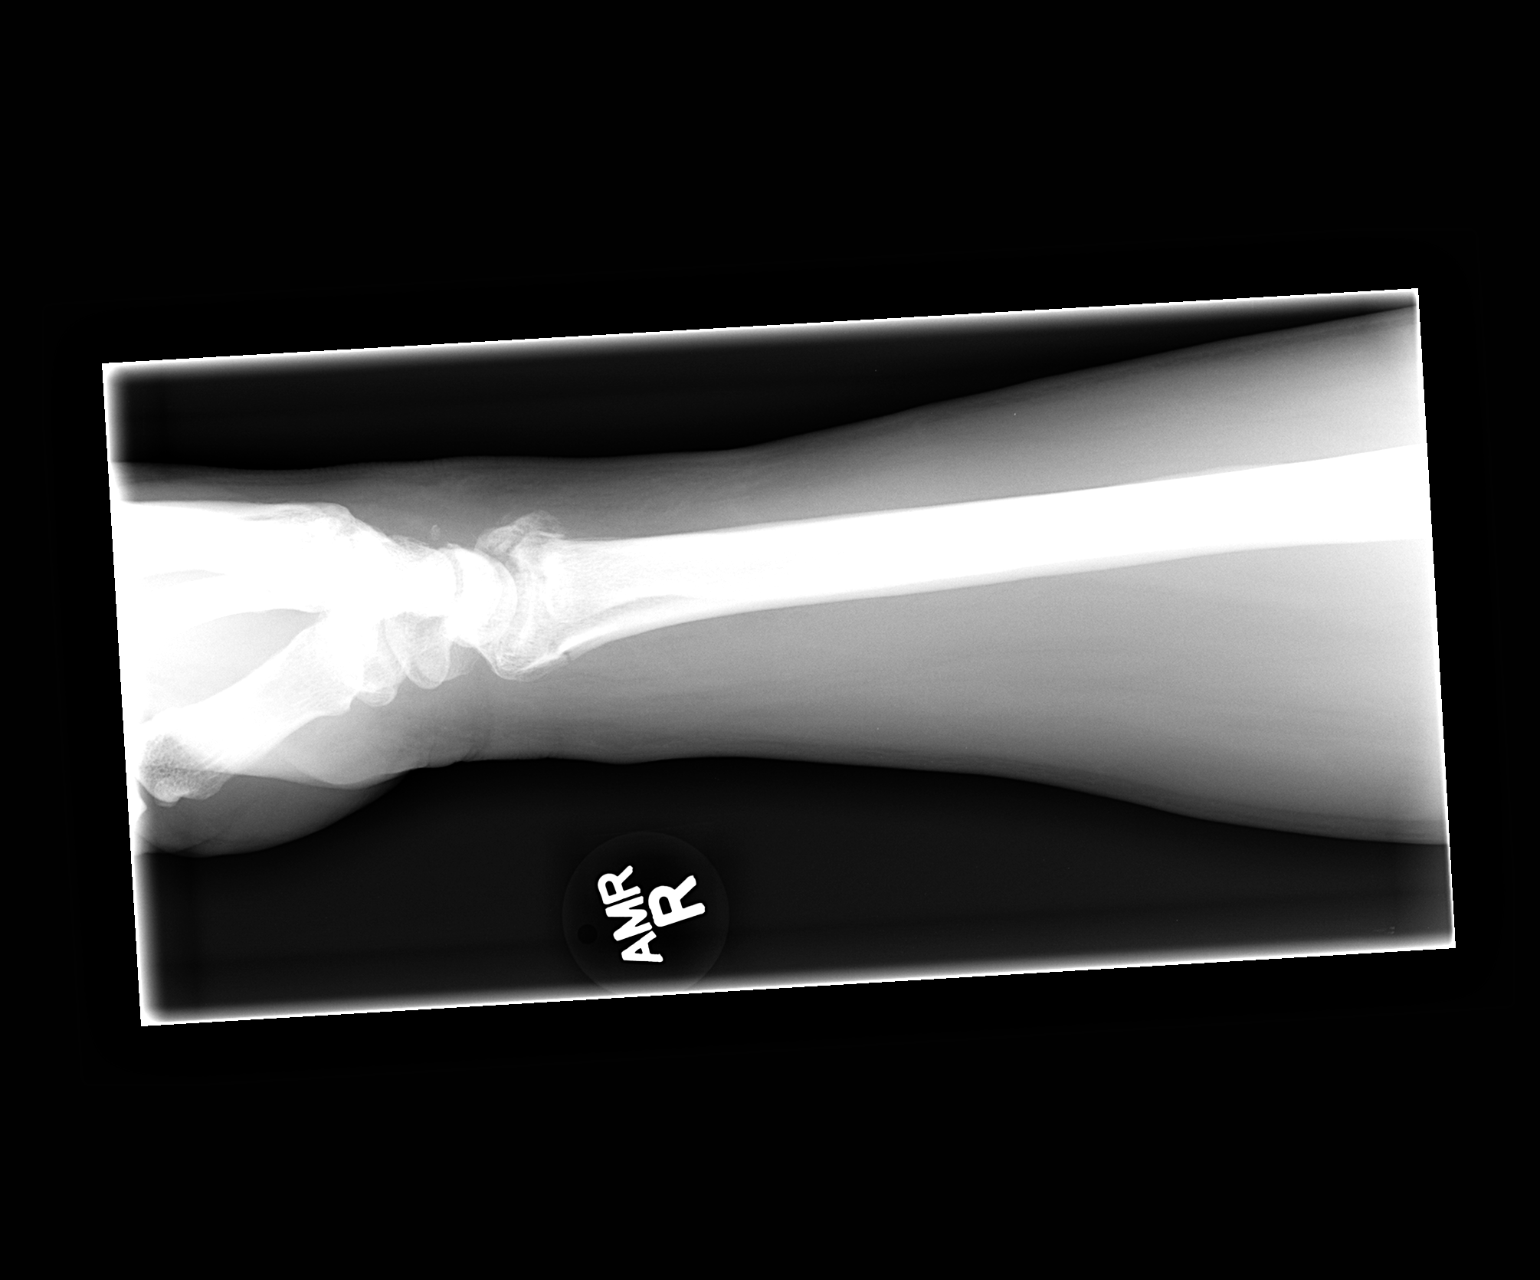

[3 of 3 positions shown; findings below may reference images not displayed]

FINDINGS: A comminuted fracture of the distal radius extends to the
joint surface.  The wrist is located.   A dorsal bone fragment is
most compatible with a triquetral fracture.
IMPRESSION: 1.  Comminuted intra-articular distal radial fracture.
2.  Small displaced posterior triquetral fracture.

## 2013-11-17 DIAGNOSIS — K219 Gastro-esophageal reflux disease without esophagitis: Secondary | ICD-10-CM

## 2013-11-17 DIAGNOSIS — I10 Essential (primary) hypertension: Secondary | ICD-10-CM

## 2013-11-17 DIAGNOSIS — E785 Hyperlipidemia, unspecified: Secondary | ICD-10-CM | POA: Insufficient documentation

## 2013-11-17 DIAGNOSIS — M109 Gout, unspecified: Secondary | ICD-10-CM | POA: Insufficient documentation

## 2013-11-17 HISTORY — DX: Gastro-esophageal reflux disease without esophagitis: K21.9

## 2013-11-17 HISTORY — DX: Essential (primary) hypertension: I10

## 2014-01-12 DIAGNOSIS — K219 Gastro-esophageal reflux disease without esophagitis: Secondary | ICD-10-CM

## 2014-01-12 DIAGNOSIS — G8929 Other chronic pain: Secondary | ICD-10-CM | POA: Insufficient documentation

## 2014-01-12 DIAGNOSIS — M25569 Pain in unspecified knee: Secondary | ICD-10-CM | POA: Insufficient documentation

## 2014-01-12 DIAGNOSIS — M545 Low back pain, unspecified: Secondary | ICD-10-CM | POA: Insufficient documentation

## 2014-01-12 HISTORY — DX: Gastro-esophageal reflux disease without esophagitis: K21.9

## 2014-02-03 DIAGNOSIS — M5116 Intervertebral disc disorders with radiculopathy, lumbar region: Secondary | ICD-10-CM | POA: Insufficient documentation

## 2014-02-03 HISTORY — DX: Intervertebral disc disorders with radiculopathy, lumbar region: M51.16

## 2014-02-15 ENCOUNTER — Ambulatory Visit: Payer: Self-pay | Admitting: Physical Medicine and Rehabilitation

## 2014-02-28 DIAGNOSIS — M5136 Other intervertebral disc degeneration, lumbar region: Secondary | ICD-10-CM | POA: Insufficient documentation

## 2014-06-08 DIAGNOSIS — M109 Gout, unspecified: Secondary | ICD-10-CM | POA: Insufficient documentation

## 2014-08-14 NOTE — Consult Note (Signed)
HEMATOLOGY followup  - denies bleeding symptoms. Bright red blood in stools is better.no fever.  Appetite is steadypatient is alert and oriented, in no acute distress. No pallor            vitals - 98, 75, 20, 133/88, 98% on room air           Lungs - bilateral good air entry           Abdomen - soft, nontender. hemoglobin 13.5, WBC 3800, ANC 2300, platelets 79K, INR 1.0.  PTT and folate unremarkable.  B12, hepatitis panel, HIV pending.  Recent CT of abdomen/pelvis showed fatty liver, no splenomegaly. mild Leukopenia - likely secondary to myelosuppressive effect of alcohol abuse versus other etiology.  CT scan and ultrasound shows fatty liver but no splenomegaly.  Hematochezia has improved, platelet count is beginning to improve. Patient being discharged home today. Will notify Dr.Gittin about this and to make followup appt for him to see patient at cancer center if he feels the need to. Patient explained above, he is agreeable to this plan.  Electronic Signatures: Jonn Shingles (MD)  (Signed on 31-Mar-13 23:24)  Authored  Last Updated: 31-Mar-13 23:24 by Jonn Shingles (MD)

## 2014-08-14 NOTE — Discharge Summary (Signed)
PATIENT NAME:  Juan Jackson, Juan Jackson MR#:  [id number] DATE OF BIRTH:  [birth year]  DATE OF ADMISSION:  07/17/2011 DATE OF DISCHARGE:  07/21/2011  For a detailed note, please take a look at the history and physical done by Dr. Lenore Manner on admission.   [place] COURSE:  1. Dr. Gaylyn Cheers from Gastroenterology   2. Dr. Barbette Reichmann from Hematology/Oncology    DIET: The patient is being discharged on a regular diet.   ACTIVITY: As tolerated.   FOLLOW-UP: Follow-up is with the [facility].   DISCHARGE MEDICATIONS:  1. Prilosec 20 mg b.i.d.  2. Nadolol 40 mg daily.  3. Tramadol 50 mg q.6 hours as needed.  4. Neurontin 100 mg b.i.d.   DIAGNOSES AT DISCHARGE:  1. Gastrointestinal bleed likely secondary to hemorrhoids.  2. Thrombocytopenia due to bone marrow suppression from heavy alcohol abuse.  3. Alcohol abuse.  4. Abnormal liver function tests secondary to fatty liver disease.  5. Chronic pain likely related to arthritis.  6. Supraventricular tachycardia.   PERTINENT STUDIES DONE DURING THE HOSPITAL COURSE ARE AS FOLLOWS:  1. CT scan of the abdomen and pelvis done with contrast on admission showing no evidence of any acute diverticulitis, no evidence of bowel obstruction or ileus, decreased density diffusely within the liver consistent with fatty infiltration. No evidence of hepatobiliary abnormality.  2. Abdominal ultrasound, limited, showing fatty infiltration of the liver.   HOSPITAL COURSE: The patient is a 49 year old male with medical problems as mentioned above who presented to the hospital with multiple episodes of bright red blood per rectum and also thrombocytopenia.  1. GI bleed. The patient had multiple episodes of bright red blood per rectum prior to coming into the hospital and was severely thrombocytopenic. A GI consult was obtained on admission. The patient was seen by Dr. Vira Agar and also by Dr. Gustavo Lah. As per their impression, his bleeding  was probably related to hemorrhoidal bleeding but has gotten worse because his platelet count was low. The patient was observed in the hospital for a few days and had serial hemoglobins drawn which did not get any worse. He remained hemodynamically stable. Initially was just on a liquid diet, slowly advanced to a regular diet and has been able to tolerate without evidence of abdominal pain or any problems with dropping of his hemoglobin. There was some concern that given his history of chronic alcohol abuse he may have some underlying esophageal varices but he did not have any melanotic stools or any hematemesis. He was although discharged on proton pump inhibitors b.i.d. along with nadolol as mentioned.  2. Leukopenia and thrombocytopenia. The patient presented to the hospital with platelet count as low as 39,000. He did receive 1 unit of platelet transfusion during the hospital. His platelet count on day of discharge is 79,000. As mentioned, he did not have any further evidence of bleeding during the hospital course. A Hematology/Oncology consult was obtained and, as per their evaluation, they thought that a lot of his symptoms and laboratory abnormalities were related to alcohol abuse and bone marrow suppression. They thought if he would abstain from alcohol his counts would improve. His counts did improve after some platelet transfusions as mentioned. The patient is to follow-up with Dr. Inez Pilgrim if needed as outpatient at the [facility]. His leukopenia has also since then resolved.  3. Abnormal liver function tests. This was probably secondary to alcohol abuse and fatty infiltration. This should improve as he continues to abstain from alcohol.  4. Chronic pain. The patient was discharged on some tramadol as needed but he has chronic pain issues and likely needs to get himself established with a primary care physician or with a pain management consult as an outpatient. 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  5. Supraventricular tachycardia. The patient did develop some episodes of SVT during the hospital course. He was given one dose of IV Cardizem which seemed to alleviate his SVT, although after being started on nadolol for prophylaxis from esophageal varices he has had no further episodes and will resume that.   CODE STATUS: The patient is a FULL CODE.   TIME SPENT: 40 minutes.  ____________________________ Belia Heman. Verdell Carmine, MD vjs:drc D: 07/21/2011 14:34:12 ET T: 07/23/2011 11:11:15 ET JOB#: [id number]  cc: Belia Heman. Verdell Carmine, MD, <Dictator> [facility] Henreitta Leber MD ELECTRONICALLY SIGNED 07/23/2011 15:00

## 2014-08-14 NOTE — H&P (Signed)
PATIENT NAME:  Juan Jackson, Juan Jackson MR#:  [id number] DATE OF BIRTH:  [birth year]  DATE OF ADMISSION:  07/17/2011  PRIMARY CARE PHYSICIAN: The patient has no primary care physician.   CHIEF COMPLAINT: Bright red blood per rectum on and off x4 days.   HISTORY OF PRESENT ILLNESS: Juan Jackson is a 49 year old Caucasian male with history of chronic alcoholism. He was in his usual state of health until about four days ago when he started to see on and off bright red blood per rectum, sometimes with clots. He states that today he had some bleeding with urination as well, but that was self-limited. He also describes mild dizziness. He was evaluated here at the emergency department and the findings are consistent with mild anemia, severe thrombocytopenia, and orthostatic blood pressure variation. The patient was admitted for further evaluation and management.  REVIEW OF SYSTEMS: CONSTITUTIONAL: Denies any fever. No chills. No night sweats. No fatigue. EYES: No blurring of vision. No double vision. ENT: No hearing impairment. No sore throat. No dysphagia. CARDIOVASCULAR: No chest pain. No shortness of breath. No syncope. RESPIRATORY: No cough. No sputum production. No chest pain. No shortness of breath. GASTROINTESTINAL: No abdominal pain apart from mildly crampy feeling of the periumbilical area. No vomiting. No diarrhea. He reports rectal bleeding as above. GENITOURINARY: No dysuria. No frequency of urination. He reported one episode of blood with the urine earlier this morning. MUSCULOSKELETAL: No joint pain or swelling, other than his chronic back pain. No muscular pain or swelling. INTEGUMENTARY: No skin rash. No ulcers. NEUROLOGY: No focal weakness. No seizure activity. No headache. He has chronic numbness in the left leg. PSYCHIATRY: He has mild anxiety. No depression. ENDOCRINE: No polyuria or polydipsia. No heat or cold intolerance.   PAST MEDICAL HISTORY:  1. History of chronic back pain stating this is the  reason why he is drinking alcohol to ease off his pain.  2. Numbness in his left leg. This is chronic for him.   FAMILY HISTORY: His father had hypertension and diabetes and he died from complications of chronic alcoholism. There is no family history of liver cirrhosis.   SOCIAL HABITS: He drinks alcohol every day. The amount is about one fifth. He drinks [employer] He states that he started drinking three years ago. No smoking currently. He has remote history of smoking, but he quit 15 years ago. No other drug abuse.   SOCIAL HISTORY: He is unemployed and he does not follow with any physician.   ADMISSION MEDICATIONS: He does not recall the names. He has three pills. One is an anti-inflammatory, the other is for numbness, and the third one he takes to calm his nerves.  ALLERGIES: No known drug allergies.   PHYSICAL EXAMINATION:   VITAL SIGNS: Blood pressure 168/100, respiratory rate 20, pulse 93, temperature 97.7, and oxygen saturation 99%.   GENERAL APPEARANCE: Young male sitting at the bedside in no acute distress.   HEAD AND NECK EXAMINATION: No pallor. No icterus. No cyanosis.  EARS, NOSE, AND THROAT:  Hearing was normal. Nasal mucosa, lips, and tongue were normal.   EYES: Normal iris and conjunctivae. Pupils are about 5 mm, equal and reactive to light.   NECK: Supple. Trachea at midline. No thyromegaly. No cervical lymphadenopathy. No masses.   HEART: Normal S1 and S2. No S3 or S4. No murmur. No gallop. No carotid bruits.   LUNGS: Normal breathing pattern without use of accessory muscles. No rales. No wheezing.   ABDOMEN: Soft without tenderness.  No hepatosplenomegaly. No masses. No hernias.   SKIN: No ulcers. No subcutaneous nodules.   MUSCULOSKELETAL: No joint swelling. No clubbing.   NEUROLOGIC: Cranial nerves II through XII are intact. No focal motor deficit.   PSYCHIATRIC: The patient is alert and oriented x3. Mood and affect were normal.   LABS/STUDIES: He had  a CAT scan of the abdomen and pelvis done in the emergency department and that showed no acute intraperitoneal inflammation or any bowel obstruction or any mass.   CBC showed a white count of 1700, hemoglobin 13, hematocrit 38, platelet count 33,000, MCV 90, and MCH and MCHC were normal. Serum glucose 105, BUN 9, creatinine 0.5, sodium 138, and potassium 3.6. Alcohol level 0.26. Liver function tests showed a total protein of 8.5, albumin 4.6, bilirubin 1.9, alkaline phosphatase 81, AST 202, and ALT 136. Prothrombin time 13. INR 1. APTT 29.  Urinalysis is unremarkable. White blood cells were 2 and RBC's were only 1 or less.   IMPRESSION:  1. Rectal bleeding, unsure if this is a lower gastrointestinal bleed or upper gastrointestinal bleed with rapid transit. 2. Severe thrombocytopenia, likely from his alcoholism and possible hypersplenism.  3. Mild anemia.  4. Orthostatic changes. His pulse in the lying position was 96 and standing was 136. There is not much blood pressure variation.  5. Alcoholic hepatitis.  6. Chronic alcoholism. 7. Chronic back pain.  8. Peripheral neuropathy.  PLAN: We will admit the patient to telemetry. Check hemoglobin every 8 hours. Stool for guaiac was brown, but heme positive. I started him on IV Protonix. Keep the patient n.p.o. until           evaluation by Gastroenterology. Gastroenterology consultation. Ativan 0.5 mg IV every 4 hours p.r.n. for anxiety. Hopefully he will not have withdrawal symptoms. For his back pain, we will use morphine for the time being, 1 mg every four hours p.r.n.   TIME SPENT EVALUATING THIS PATIENT: More than 50 minutes.  ____________________________ Clovis Pu. Lenore Manner, MD amd:slb D: 07/17/2011 22:52:47 ET T: 07/18/2011 07:38:05 ET JOB#: [id number]  cc: Clovis Pu. Lenore Manner, MD, <Dictator> Ellin Saba MD ELECTRONICALLY SIGNED 07/23/2011 23:38

## 2014-08-14 NOTE — Consult Note (Signed)
Pt with BRBPR with bowel movement.  Also has pain in abd, esp in left abd and left upper abd.  His plts are low and he is to get transfusion of these today.  HGB stable so bleed is very likely due to internal hemorrhoids.  No new suggstions.  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  Electronic Signatures: Manya Silvas (MD)  (Signed on 29-Mar-13 14:13)  Authored  Last Updated: 29-Mar-13 14:13 by Manya Silvas (MD)

## 2014-08-14 NOTE — Consult Note (Signed)
PATIENT NAME:  Juan Jackson, Juan Jackson MR#:  [id number] DATE OF BIRTH:  [birth year]  DATE OF CONSULTATION:  07/18/2011  REFERRING PHYSICIAN:   CONSULTING PHYSICIAN:  Theodore Demark, NP  PRIMARY CARE PHYSICIAN: Patient has no primary care physician.  HISTORY OF PRESENT ILLNESS: Mr. Dauphinais is a 49 year old Caucasian male with a history of chronic alcoholism. GI has been consulted by Dr. Lenore Manner due to his rectal bleeding and thrombocytopenia. The patient states that he drinks a fifth of whiskey daily for the last several years. Last drink was yesterday. States history of anemia but no known history of leukopenia or thrombocytopenia. Reports that he started having loose stools with bright red blood last Sunday 3 to 4 stools a day accompanied by mid abdominal cramping. Cramping was relieved with defecation. Stools of variable amounts. Was unable to tell the color of the stool due to blood but denies black, tarry stools. Patient states that this issue has improved over the last couple of days. States there was only a tiny bit on the toilet paper today. His most recent hemoglobin was 12.2. He denies nausea, vomiting, acid reflux and other gastrointestinal symptoms. Uses well water. Does not think it ever has been sanitized. Denies sick exposures, changes in eating habits, recent antibiotics, travel. No pets. States he had normal colonoscopy in 1989. No NSAID use. There was no evidence of inflammation, obstruction or colitis on CT in regards to his thrombocytopenia he does also have some significant leukopenia and has elevated transaminases and an alcoholic pattern. His liver appears to have fatty infiltration on CT. The spleen appears normal. He has no knowledge of any viral hepatitis. He has tattoos. States he was incarcerated once in 1980. 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 There is no family history of liver disease.   PAST  MEDICAL HISTORY:  1. Chronic back pain. 2. Neuropathy in feet. 3. Alcoholism. 4. Tonsillectomy, adenoidectomy.   ALLERGIES: No known drug allergies.   FAMILY HISTORY: Positive for hypertension, diabetes. Father had chronic alcoholism. Mother with stomach cancer, hysterectomy secondary to another type of cancer, myocardial infarction.  Sister with type 1 diabetes and thyroid disease. No known peptic ulcer disease in family.  MEDICATIONS: None. The patient states he has been on anti-inflammatory, antianxiety and another medication for numbness, but does not recall the name. Says he stopped taking these medications one month ago.   SOCIAL HISTORY: Fifth of whiskey every day. No tobacco, or illicits.   LABORATORY, DIAGNOSTIC AND RADIOLOGICAL DATA: Most recent lab work: Glucose 105, BUN 9, creatinine 0.59, sodium 138, K 3.6, CO2 24, GFR greater than 60, calcium 9.4, lipase 365, ethanol on admission was 266, total protein 8.5, albumin 4.6, total bilirubin 1.9, alkaline phosphatase 81, AST 202, ALT 136. WBC 1.7, hemoglobin 12.2, hematocrit 38.1, platelet 33, PT 13.3, INR 1. CT with dense fatty liver. Gallbladder, pancreas, spleen, adrenals, kidneys appear normal. There is no ileus or obstruction or inflammation, colitis or diverticula. Prostate appears normal.    PHYSICAL EXAMINATION:  VITAL SIGNS: Most recent vital signs: Temperature 98.8, pulse 96, respiratory rate 20, blood pressure 154/100, oxygen saturation 97% on room air.   GENERAL: Well appearing man resting comfortably in bed in no acute distress.   PSYCH: Pleasant, cooperative. Logical train of thought. Good memory skills.   HEENT: Eyes well set. No redness, drainage, or inflammation to the eyes or the nares. Normocephalic, atraumatic. Oral mucous membranes are pink and moist.   NECK:  No adenopathy, thyromegaly, or JVD.   RESPIRATORY: Respirations eupneic. Lungs clear to auscultation bilaterally.   CARDIAC: S1, S2, regular rate and  rhythm. No MRG. No edema. Peripheral pulses 2+.   ABDOMEN: Nondistended. Active BS x4. Nontender. No hepatosplenomegaly, rebound tenderness, peritoneal signs, hernias or masses.   GENITOURINARY: Deferred.   RECTAL: Deferred.   EXTREMITIES: MAEW x4. Strength 5/5. No clubbing, cyanosis, or edema.   NEUROLOGIC: Alert and oriented x3. Cranial nerves II through XII grossly intact. Speech clear. No facial droop.   IMPRESSION AND PLAN: Rectal bleeding improving. Likely was anal outlet or irritation due to increased stooling with extremely low platelets. With low platelet and WBC cannot recommend endoscopy at this time unless for emergent reasons. Gastroenterology will follow with this. Thrombocytopenia may be related to his chronic alcoholism. Additionally with leukopenia would like to screen for viral hepatitis as well as abdominal ultrasound, AFP and AFNA. Recommend hematologic consult.    These services were provided by Stephens November, MSN, [place] in collaboration with Lollie Sails, M.D.   ____________________________ Theodore Demark, NP chl:cms D: 07/18/2011 19:23:06 ET T: 07/19/2011 08:07:57 ET JOB#: [id number]  cc: Theodore Demark, NP, <Dictator> [place] SIGNED 07/22/2011 19:47

## 2014-08-14 NOTE — Consult Note (Signed)
         Electronic Signatures: Dallas Schimke (MD)  (Signed 28-Mar-13 21:42)  Authored: Brief Consult Note   Last Updated: 28-Mar-13 21:42 by Dallas Schimke (MD)

## 2014-08-14 NOTE — Consult Note (Signed)
Chief Complaint:   Subjective/Chief Complaint Patietn seen and examined, please see Full GI consult.  Patietn admitted with rectal bleeding in the setting of neutrapenia and thrombocytopenia.  Rectal bleeding much less today, and patient states it was less yesterday than he has seen with hemorrhoidal bleeding in the past.  History of daily long term etoh abuse.  Recommend further evaluation of liver disease wiht labs as outlined, abd [place].  Recommend hematology consult.  No plans for luminal evaluation currently in the setting of thrombocytopenia/neutrapenia/no recurrent bleeding and stable hgb.   Dr Vira Agar covering over the weekend, I will be back monday.   Electronic Signatures: Loistine Simas (MD)  (Signed 28-Mar-13 19:18)  Authored: Chief Complaint   Last Updated: 28-Mar-13 19:18 by Loistine Simas (MD)

## 2014-08-14 NOTE — Consult Note (Signed)
PATIENT NAME:  Juan Jackson, Juan Jackson MR#:  [id number] DATE OF BIRTH:  Apr 02, 1966  DATE OF CONSULTATION:  07/18/2011  REFERRING PHYSICIAN:   CONSULTING PHYSICIAN:  Theodore Demark, NP  ADDENDUM  REVIEW OF SYSTEMS: CONSTITUTIONAL: Denies fever, weight changes, chills, sweats, mild fatigue. HEENT: No complaints of headache, changes to vision, hearing loss, tinnitus, sore throat, or problems swallowing. CARDIOVASCULAR: No chest pain, edema, shortness of breath, palpitations. RESPIRATORY: No cough, wheeze, shortness of breath. GASTROINTESTINAL: As noted above. GENITOURINARY: No complaints of dysuria, hematuria or frequency. MUSCULOSKELETAL: No unusual muscle or joint pain. Does have chronic back pain. INTEGUMENTARY: No erythema, lesion or rash. NEUROLOGIC: No history of stroke, weakness, tingling, seizures. Does have some chronic leg numbness to the left leg. PSYCH: Intermittent anxiety. No history of depression. ENDOCRINE: No polyuria, polydipsia, hot or cold intolerances. No history of diabetes or hypothyroidism.   ____________________________ Theodore Demark, NP chl:cms D: 07/30/2011 08:20:25 ET T: 07/30/2011 08:40:46 ET JOB#: [id number]  cc: Theodore Demark, NP, <Dictator> [place] SIGNED 08/05/2011 13:47

## 2014-08-14 NOTE — Consult Note (Signed)
HEMATOLOGY followup note - denies new complaints, still feels tired.  Also continues to have bright red blood in stools.  No other bleeding symptomsno fever.  Appetite is steadyalert and oriented, in no acute distress. No pallor            vitals - 99, 112, 20, 146/99, 99% on room air           Lungs - bilateral good air entry           Abdomen - obese, soft, nontender. hemoglobin 14.4, WBC 2500, ANC 2000, platelets 27K, INR 1.0.  PTT and folate unremarkable.  B12, hepatitis panel, HIV pending.  Recent CT of abdomen/pelvis showed fatty liver, no splenomegaly. mild Leukopenia - likely secondary to myelosuppressive effect of alcohol abuse versus other etiology.  CT scan and ultrasound shows fatty liver but no splenomegaly.  Continues to have hematochezia, platelet count still quite low at 27K.  GI following.  Given ongoing bleeding issue, will give supportive platelet transfusion 1 unit today and check posttransfusion count.  Patient explained about rationale, risks and benefits of transfusion and he is agreeable to this.  Continue to monitor CBC.  Will continue to follow.  Electronic Signatures: Jonn Shingles (MD)  (Signed on 29-Mar-13 22:05)  Authored  Last Updated: 29-Mar-13 22:05 by Jonn Shingles (MD)

## 2014-08-14 NOTE — Consult Note (Signed)
Brief Consult Note: Diagnosis: Rectal bleeding.   Patient was seen by consultant.   Consult note dictated.   Discussed with Attending MD.   Comments: Appreciate  for consult for 49 y/o caucasian man with history of chronic alcoholism and chronic back pain for rectal bleeding and thrombocytopenia. Patient states that he drinks a 5th of whisky daily for several years, last drink yesterday. States a history of anemia, but no known history of leukopenia or thrombocytopenia- but patient is quite leukopenic at 1.7, and thrombocytopenic at 33.   Reports he started having loose stools with bright red blood on Sunday, 3-4 stools a day, accompanied by mid abd cramping. Cramping was relieved with defecation. Variable amounts. Was unable to tell color of stool due to blood, but denies black tarry stools. Patient states this has improved over the last couple of days: states there was only a tiny bit on the toilet paper today. Most recent hgb is 12.2; Denies NV, acid reflux, and other Gi symptoms. Uses well water, doesn;t think it has ever been sanitized. Denies sick exposures, changes in eating habits, recent antibiotics/foreign travel. No pets. States he had normal colonoscopy 1989. No NSAIDs. There was no evidence of inflammation, obstruction, or colitis on CT.  In regards to his thrombocytopenia, he does have elevated transaminases in an alcoholic pattern.  His liver appears to have fatty infiltration on CT. The spleen appears normal. He has no knowledge of any viral hepatitis. He has tattoos, states he was incarcerated once in the 1980s. 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  There is no family history of lviver disease.  Impression: Rectal bleeding: improving.  Likely was anal outlet or irritation due to increased stooling with extremely low platelets  . With low platelet and wbc, cannot recommend endoscopy at this time. Will follow.                   Thrombocytopenia: may be related to his chronic alcholism. Would like to screen for viral hepatitis, especially with leukopenia. Will  assess [place], AFP, and ASMA. Recommend hematology consult  Electronic Signatures: Theodore Demark (NP)  (Signed 28-Mar-13 17:30)  Authored: Brief Consult Note   Last Updated: 28-Mar-13 17:30 by Theodore Demark (NP)

## 2014-08-14 NOTE — Consult Note (Signed)
Discussed with Hospitalist.  Agree with advance diet, no rectal bleed overnight.  Due to likelyhood of portal hypertension, evidence of systolic hypertension and tachycardia will start nadolol at 40mg  a day.  If does well can from GI standpoint go home tomorrow  Electronic Signatures: Manya Silvas (MD)  (Signed on 30-Mar-13 09:58)  Authored  Last Updated: 30-Mar-13 09:58 by Manya Silvas (MD)

## 2014-08-14 NOTE — Consult Note (Signed)
Brief Consult Note: Diagnosis: LEUKOPENIA AND THROMBOCYTOPENIA.   Comments: PATIENT SEEN CHART REVIEWED DICTATED NOTE TO FOLLOW,  NEUTROPENIA BUT OK AT 1500 POLYS, NO INFECTION, PLTS 28K, ESSENTIALLY STABLE FROM 33 EARLIER, NO ACTIVE BLEEDING, PT OK, NO SPLENOMEGALY, HAS FATTY INFILTRATION LIVER....CYTOPENIA VIRTUALLY CERTAIN TO BE DIRECT MARROW EFFECT OF ALCOHOL/AND/OR POSSIBLE ROLE OF HEP C OR HIV, MUT BE RULED OUT. NOTHING ACUTELY FROM HEMATOLOGY.. WOULD F/U PLTS BID UNTIL STABLE. CHECK HEP C AND HIV. ALREADY SUPPLEMENTING VITAMINS..IF BLEEDING OCCURS WILL NEED PLT TRANSFUSION IF PLTS ARE LESS THAN 90K. IF PLTS AND WBC DO NOT NORMALIZE WITH ABSTINENNCE FROM ALCOHOL, THEN FURTHUR W/U BEGINNING WITH ELECTROPHERESIS STUDIES AND POSSIBLE BM EXAM WOULD BE INDICATED. ALSO SUGGEST GU EVLUATION FOR REPORTED GROSS HEMATURIA.  Electronic Signatures: Dallas Schimke (MD)  (Signed 28-Mar-13 22:04)  Authored: Brief Consult Note   Last Updated: 28-Mar-13 22:04 by Dallas Schimke (MD)

## 2014-08-17 ENCOUNTER — Ambulatory Visit
Admit: 2014-08-17 | Disposition: A | Discharge status: Home / Self Care | Payer: Self-pay | Attending: Physical Medicine and Rehabilitation | Admitting: Physical Medicine and Rehabilitation

## 2014-08-25 ENCOUNTER — Encounter: Payer: Self-pay | Admitting: Oncology

## 2014-08-25 ENCOUNTER — Ambulatory Visit: Payer: Commercial Managed Care - HMO

## 2014-08-25 ENCOUNTER — Encounter (INDEPENDENT_AMBULATORY_CARE_PROVIDER_SITE_OTHER): Payer: Self-pay

## 2014-08-25 ENCOUNTER — Inpatient Hospital Stay: Payer: Commercial Managed Care - HMO | Attending: Oncology | Admitting: Oncology

## 2014-08-25 VITALS — BP 149/102 | HR 77 | Temp 96.2°F | Resp 20 | Ht 74.8 in | Wt 293.2 lb

## 2014-08-25 DIAGNOSIS — M109 Gout, unspecified: Secondary | ICD-10-CM | POA: Insufficient documentation

## 2014-08-25 DIAGNOSIS — G629 Polyneuropathy, unspecified: Secondary | ICD-10-CM | POA: Insufficient documentation

## 2014-08-25 DIAGNOSIS — Z79899 Other long term (current) drug therapy: Secondary | ICD-10-CM

## 2014-08-25 DIAGNOSIS — M545 Low back pain: Secondary | ICD-10-CM | POA: Diagnosis not present

## 2014-08-25 DIAGNOSIS — N289 Disorder of kidney and ureter, unspecified: Secondary | ICD-10-CM | POA: Diagnosis not present

## 2014-08-25 DIAGNOSIS — F1010 Alcohol abuse, uncomplicated: Secondary | ICD-10-CM | POA: Diagnosis not present

## 2014-08-25 DIAGNOSIS — K709 Alcoholic liver disease, unspecified: Secondary | ICD-10-CM | POA: Insufficient documentation

## 2014-08-25 DIAGNOSIS — N2889 Other specified disorders of kidney and ureter: Secondary | ICD-10-CM | POA: Diagnosis not present

## 2014-08-25 DIAGNOSIS — G8929 Other chronic pain: Secondary | ICD-10-CM | POA: Insufficient documentation

## 2014-08-25 DIAGNOSIS — I10 Essential (primary) hypertension: Secondary | ICD-10-CM | POA: Diagnosis not present

## 2014-08-25 DIAGNOSIS — Z87891 Personal history of nicotine dependence: Secondary | ICD-10-CM

## 2014-08-25 LAB — URINALYSIS COMPLETE WITH MICROSCOPIC (ARMC ONLY)
Bacteria, UA: NONE SEEN
Bilirubin Urine: NEGATIVE
Glucose, UA: NEGATIVE mg/dL
Hgb urine dipstick: NEGATIVE
Ketones, ur: NEGATIVE mg/dL
Leukocytes, UA: NEGATIVE
NITRITE: NEGATIVE
PH: 6 (ref 5.0–8.0)
PROTEIN: NEGATIVE mg/dL
RBC / HPF: NONE SEEN RBC/hpf (ref 0–5)
SPECIFIC GRAVITY, URINE: 1.011 (ref 1.005–1.030)
Squamous Epithelial / LPF: NONE SEEN
WBC, UA: NONE SEEN WBC/hpf (ref 0–5)

## 2014-08-25 LAB — CBC WITH DIFFERENTIAL/PLATELET
BASOS ABS: 0.1 10*3/uL (ref 0–0.1)
BASOS PCT: 2 %
EOS PCT: 3 %
Eosinophils Absolute: 0.1 10*3/uL (ref 0–0.7)
HEMATOCRIT: 43.1 % (ref 40.0–52.0)
Hemoglobin: 14 g/dL (ref 13.0–18.0)
Lymphocytes Relative: 28 %
Lymphs Abs: 1.5 10*3/uL (ref 1.0–3.6)
MCH: 27.8 pg (ref 26.0–34.0)
MCHC: 32.5 g/dL (ref 32.0–36.0)
MCV: 85.5 fL (ref 80.0–100.0)
MONO ABS: 0.4 10*3/uL (ref 0.2–1.0)
MONOS PCT: 7 %
Neutro Abs: 3.2 10*3/uL (ref 1.4–6.5)
Neutrophils Relative %: 60 %
PLATELETS: 122 10*3/uL — AB (ref 150–440)
RBC: 5.05 MIL/uL (ref 4.40–5.90)
RDW: 15 % — ABNORMAL HIGH (ref 11.5–14.5)
WBC: 5.3 10*3/uL (ref 3.8–10.6)

## 2014-08-25 LAB — PROTIME-INR
INR: 0.96
PROTHROMBIN TIME: 13 s (ref 11.4–15.0)

## 2014-08-25 LAB — APTT: APTT: 29 s (ref 24–36)

## 2014-08-25 NOTE — Progress Notes (Signed)
Patient is followed by Dr. Sharlet Salina for low back pain when a kidney mass was seen on MRI.  A f/u MRI was repeated 6 months later and the mass was larger.

## 2014-08-26 ENCOUNTER — Other Ambulatory Visit: Payer: Self-pay | Admitting: Licensed Clinical Social Worker

## 2014-08-26 LAB — COMPREHENSIVE METABOLIC PANEL
ALT: 32 U/L (ref 17–63)
AST: 22 U/L (ref 15–41)
Albumin: 4.3 g/dL (ref 3.5–5.0)
Alkaline Phosphatase: 53 U/L (ref 38–126)
Anion gap: 10 (ref 5–15)
BUN: 17 mg/dL (ref 6–20)
CO2: 25 mmol/L (ref 22–32)
CREATININE: 0.99 mg/dL (ref 0.61–1.24)
Calcium: 9.5 mg/dL (ref 8.9–10.3)
Chloride: 104 mmol/L (ref 101–111)
GFR calc Af Amer: >60 mL/min (ref >60)
GFR calc non Af Amer: >60 mL/min (ref >60)
GLUCOSE: 85 mg/dL (ref 65–99)
Potassium: 3.9 mmol/L (ref 3.5–5.1)
Sodium: 139 mmol/L (ref 135–145)
TOTAL PROTEIN: 7.6 g/dL (ref 6.5–8.1)
Total Bilirubin: 0.3 mg/dL (ref 0.3–1.2)

## 2014-08-26 NOTE — Patient Outreach (Signed)
   Assessment:  CSW [employer] received referral on [employer]. [place].  CSW completed chart review on client on 08/26/14.  CSW called home phone number of client on 08/26/14 and spoke via phone with client on 08/26/14.  CSW verified identity of client.  CSW introduced self and informed client of [facility] program and program services.  CSW received verbal permission from client on 08/26/14 to speak with client about current status and medical needs of client. Client spoke of his primary doctor, Dr. Glendon Axe.  Client also said that he met on 08/25/14 with an oncologist in [place], [place].  Client said he now has a kidney mass and is working with medical providers to determine the best plan of treatment/care related to his newly diagnosed kidney mass.  He said that he understood that the kidney mass (cancer) was contained in the kidney area.  He said that he thought he would have to eventually have surgery to remove kidney with kidney mass.  He said he was not sure when that surgery might be scheduled. He said he and his son met with oncologist in [place], [place] on 08/25/14.  Client said he is fairly independent in lifestyle. He has no problem with activities of daily living. He drives himself to most appointments. He said he has adequate food supply.  He has family support from his son, Atharv Barriere. Rhunette Croft.  Client said he has his prescribed medications.  Client said he is facing some financial challenges and it is sometimes difficult for him to pay the copay amounts due at his scheduled medical appointments. CSW and client spoke of care plan for client and that client might work to develop monthly financial budget to help him plan for financial obligations each month. CSW spoke with client about consent form and completion of [facility] consent form. CSW asked Freda Jackson on 08/26/14 to please mail to client [facility] consent form and return envelope. Freda Jackson said he would mail [facility] consent and return envelope to client on  08/26/14.   CSW informed client that he would receive [facility] consent form and return envelope in the mail soon.  Client and CSW completed needed assessment information for client.  CSW reminded client that since client was under the medical care of Dr. Glendon Axe, a [facility] medical provider, that [facility] program services were thus free to client. CSW gave client CSW name and phone number on 08/26/14.  Client wrote down the CSW name and phone number on 08/26/14.  CSW also informed client that [place] may be contacting him also via phone to speak about current nursing needs of client. Client agreed to receive phone call from [place] to discuss nursing needs of client.   Plan:  Client to review and complete [facility] consent form and return completed [facility] consent form to [facility] office in [place], [place] Client to take medications as prescribed and to attend scheduled medical appointments. Client to communicate, as needed, with [facility] RN Maury Dus to discuss nursing needs of client. CSW to call client in two weeks to assess needs of client at that time.   Norva Riffle.Daneesha Quinteros MSW, LCSW Licensed Clinical Social Worker [facility] Care Management [phone]

## 2014-08-29 ENCOUNTER — Other Ambulatory Visit: Payer: Self-pay | Admitting: Oncology

## 2014-08-29 DIAGNOSIS — N2889 Other specified disorders of kidney and ureter: Secondary | ICD-10-CM | POA: Insufficient documentation

## 2014-08-29 NOTE — Progress Notes (Signed)
[place]  [phone] [phone]  ID: Juan Jackson OB: 05-09-1965  MR#: [id number]  SNK#:[id number]  Patient Care Team: Glendon Axe, MD as PCP - General (Internal Medicine) Len Blalock, RN as Registered Nurse Sharlet Salina, MD as Referring Physician (Physical Medicine and Rehabilitation) Katha Cabal, LCSW as Social Worker (Licensed Clinical Social Worker)  CHIEF COMPLAINT:  Chief Complaint  Patient presents with  . New Evaluation    Kidney mass seen on MRI    INTERVAL HISTORY: Patient is a 49 year old male who was being evaluated for chronic back pain and found to have an incidental lesion on his right kidney. Repeat MRI 6 months later revealed the lesion had increased in size concerning for renal cell carcinoma. Currently, patient feels well and is asymptomatic. He has no neurologic complaints. He denies any fevers. He has a good appetite and denies weight loss. He denies any chest pain or shortness of breath. He denies any nausea, vomiting, constipation, or diarrhea. He has no urinary complaints. Patient feels well and offers no specific complaints today.  REVIEW OF SYSTEMS:   Review of Systems  Constitutional: Negative.   Musculoskeletal: Positive for back pain.    As per HPI. Otherwise, a complete review of systems is negatve.  PAST MEDICAL HISTORY: Past Medical History  Diagnosis Date  . Hypertension   . Gout   . Liver disease, chronic   . ETOH abuse   . Lumbar back pain   . Peripheral neuropathy     PAST SURGICAL HISTORY: Past Surgical History  Procedure Laterality Date  . Tonsillectomy      FAMILY HISTORY Family History  Problem Relation Age of Onset  . Cancer Mother   . Diabetes Mother   . Coronary artery disease Mother   . Coronary artery disease Father   . Hypertension Father   . Diabetes Father   . Diabetes Sister   . Diabetes Brother   . Cirrhosis Brother   . Diabetes Sister   .  Thyroid disease Sister        ADVANCED DIRECTIVES:    HEALTH MAINTENANCE: History  Substance Use Topics  . Smoking status: Former Smoker    Quit date: 12/22/1991  . Smokeless tobacco: Never Used  . Alcohol Use: No     Colonoscopy:  PAP:  Bone density:  Lipid panel:  Allergies  Allergen Reactions  . Morphine And Related Nausea And Vomiting    Current Outpatient Prescriptions  Medication Sig Dispense Refill  . allopurinol (ZYLOPRIM) 300 MG tablet Take 300 mg by mouth daily.    Marland Kitchen lisinopril-hydrochlorothiazide (PRINZIDE,ZESTORETIC) 10-12.5 MG per tablet Take 1 tablet by mouth daily.    . meloxicam (MOBIC) 15 MG tablet Take 15 mg by mouth daily.    . pantoprazole (PROTONIX) 40 MG tablet Take 40 mg by mouth daily.    Marland Kitchen tiZANidine (ZANAFLEX) 4 MG tablet Take 4 mg by mouth every 6 (six) hours as needed for muscle spasms.    . pravastatin (PRAVACHOL) 10 MG tablet 1 tablet daily.     No current facility-administered medications for this visit.    OBJECTIVE: Filed Vitals:   08/25/14 1725  BP: 149/102  Pulse: 77  Temp: 96.2 F (35.7 C)  Resp: 20     Body mass index is 36.84 kg/(m^2).    ECOG FS:0 - Asymptomatic  General: Well-developed, well-nourished, no acute distress. Eyes: Pink conjunctiva, anicteric sclera. HEENT: Normocephalic, moist mucous membranes, clear oropharnyx. Lungs: Clear to auscultation bilaterally.  Heart: Regular rate and rhythm. No rubs, murmurs, or gallops. Abdomen: Soft, nontender, nondistended. No organomegaly noted, normoactive bowel sounds. Musculoskeletal: No edema, cyanosis, or clubbing. Neuro: Alert, answering all questions appropriately. Cranial nerves grossly intact. Skin: No rashes or petechiae noted. Psych: Normal affect. Lymphatics: No cervical, calvicular, axillary or inguinal LAD.   LAB RESULTS:  Lab Results  Component Value Date   NA 139 08/25/2014   K 3.9 08/25/2014   CL 104 08/25/2014   CO2 25 08/25/2014   GLUCOSE 85  08/25/2014   BUN 17 08/25/2014   CREATININE 0.99 08/25/2014   CALCIUM 9.5 08/25/2014   PROT 7.6 08/25/2014   ALBUMIN 4.3 08/25/2014   AST 22 08/25/2014   ALT 32 08/25/2014   ALKPHOS 53 08/25/2014   BILITOT 0.3 08/25/2014   GFRNONAA >60 08/25/2014   GFRAA >60 08/25/2014    Lab Results  Component Value Date   WBC 5.3 08/25/2014   NEUTROABS 3.2 08/25/2014   HGB 14.0 08/25/2014   HCT 43.1 08/25/2014   MCV 85.5 08/25/2014   PLT 122* 08/25/2014     STUDIES: Mr Abdomen W Wo Contrast  08/17/2014   CLINICAL DATA:  Follow-up indeterminate right renal lesion. Low back pain.  EXAM: MRI ABDOMEN WITHOUT AND WITH CONTRAST  TECHNIQUE: Multiplanar multisequence MR imaging of the abdomen was performed both before and after the administration of intravenous contrast.  CONTRAST:  20 mL MultiHance  COMPARISON:  Lumbar MRI 04/17/2014, CT 07/17/2011  FINDINGS: Lower chest:  Lung bases are clear.  Hepatobiliary: No focal hepatic lesion. No biliary duct dilatation. Post cholecystectomy.  Pancreas: Normal pancreatic parenchymal intensity. No ductal dilatation or inflammation.  Spleen: Normal spleen.  Adrenals/urinary tract: Adrenal glands are normal.  The lesion of concern in the upper pole of the right kidney is low signal intensity on T1 and T2 weighted imaging as well the long TE opposed phase imaging sequence which suggest calcification of this lesion. There is no evidence of enhancement. Lesion is small measuring approximately 10 mm (image 37, series 9)  Within the cortex of the lower pole of the left kidney rounded lesion measures 17 mm (image 14, series 2). This lesion is hyperintense on T2 weighted imaging and intermediate intensity on the T1 weighted imaging. This lesion demonstrates measurable mild post-contrast enhancement (series 9 and series 11).  There is no involvement renal veins.  No adenopathy.  Stomach/Bowel: Stomach and limited of the small bowel is unremarkable  Vascular/Lymphatic: Abdominal  aortic normal caliber. No retroperitoneal periportal lymphadenopathy.  Musculoskeletal: No aggressive osseous lesion  IMPRESSION: 1. Enhancing cortical lesion in the lower pole of the LEFT kidney is concerning for a renal cell carcinoma. Recommend urology consultation. 2. Small lesion in the RIGHT kidney is felt to be a nonenhancing potentially calcified renal lesion. These results will be called to the ordering clinician or representative by the Radiologist Assistant, and communication documented in the PACS or zVision Dashboard.   Electronically Signed   By: Suzy Bouchard M.D.   On: 08/17/2014 14:09    ASSESSMENT: Left kidney lesion concerning for renal cell carcinoma.  PLAN:    1. Left kidney lesion: MRI results reviewed independently. Lesion has increased in size of her 6 months and is concerning for renal cell carcinoma. Plan to refer patient to urology for further evaluation and consideration of surgical removal. We will obtain a chest CT to complete the staging process to ensure there is no metastatic disease. Patient will return to clinic in 2 months, postoperatively, for  further evaluation, discussion of his final pathology results, and any treatment planning if necessary.  Patient expressed understanding and was in agreement with this plan. He also understands that He can call clinic at any time with any questions, concerns, or complaints.   No matching staging information was found for the patient.  Lloyd Huger, MD   08/29/2014 5:53 PM

## 2014-08-31 ENCOUNTER — Other Ambulatory Visit: Payer: Self-pay

## 2014-08-31 NOTE — Patient Outreach (Signed)
[facility]) Care Management  08/31/2014  Juan Jackson August 01, 1965 [id number]   RN CM discussed with patient the services of [place].  Patient stated these services were discussed with him by [facility] social worker, Theadore Nan.  Patient reports receiving a [facility] welcome packet in the mail this week.   RN CM asked patient if he had any health care concerns. Patient stated he did not.  RN CM asked patient how was he managing his blood pressure.  Patient states he has had high blood pressure greater than 10 years now.  States he takes his medications as ordered. States he does not have a blood pressure machine, however he can tell when his blood pressure gets high.  States his vision gets blurry.  Patient understand how to contact his primary physician for any of his health issues.  Patient reports he is going to have his right kidney removed because of a mass.  States the surgery date has not been set up. Patient reports most days his pain score is a 6.  States he does not feel he needs to ask his doctor about pain medications.  Patient report his appetite is good and denies weight loss.  RN CM offered telephonic nursing support for disease management.  Patient decline the support of telephonic nursing.  States at this point all he needs is to work with our Education officer, museum for resources for helping him with his co pays.  RN CM will close this case for nursing support.  RN CM make [facility] social worker aware of patient's decision to decline nursing support.   Juan Dus, RN, Juan Jackson, Juan Jackson Telephonic Care Coordinator [phone]

## 2014-08-31 NOTE — Progress Notes (Unsigned)
08/31/14--faxed referral info to [place]

## 2014-09-01 NOTE — Patient Outreach (Signed)
[facility]) Care Management  09/01/2014  Juan Jackson 08/29/1965 [id number]   Received notification from Maury Dus, RN to close nursing portion of case due to patient declined telephonic nursing support.  However, Theadore Nan, CSW will be active with patient for finding financial support/resources.  Ronnell Freshwater. [place] CM Assistant Phone: [phone] Fax: [phone]

## 2014-09-11 DIAGNOSIS — I10 Essential (primary) hypertension: Secondary | ICD-10-CM | POA: Insufficient documentation

## 2014-09-11 DIAGNOSIS — E7800 Pure hypercholesterolemia, unspecified: Secondary | ICD-10-CM | POA: Insufficient documentation

## 2014-09-15 ENCOUNTER — Other Ambulatory Visit: Payer: Self-pay | Admitting: Urology

## 2014-09-15 DIAGNOSIS — N2889 Other specified disorders of kidney and ureter: Secondary | ICD-10-CM

## 2014-09-23 ENCOUNTER — Ambulatory Visit
Admission: RE | Admit: 2014-09-23 | Discharge: 2014-09-23 | Disposition: A | Discharge status: Home / Self Care | Payer: Commercial Managed Care - HMO | Source: Ambulatory Visit | Attending: Urology | Admitting: Urology

## 2014-09-23 DIAGNOSIS — N2889 Other specified disorders of kidney and ureter: Secondary | ICD-10-CM | POA: Diagnosis present

## 2014-09-23 DIAGNOSIS — R590 Localized enlarged lymph nodes: Secondary | ICD-10-CM | POA: Diagnosis not present

## 2014-09-23 DIAGNOSIS — K829 Disease of gallbladder, unspecified: Secondary | ICD-10-CM | POA: Diagnosis not present

## 2014-09-23 MED ORDER — IOHEXOL 350 MG/ML SOLN
125.0000 mL | Freq: Once | INTRAVENOUS | Status: AC | PRN
  Administered 2014-09-23: 125 mL via INTRAVENOUS

## 2014-10-03 ENCOUNTER — Encounter
Admission: RE | Admit: 2014-10-03 | Discharge: 2014-10-03 | Disposition: A | Discharge status: Home / Self Care | Payer: Commercial Managed Care - HMO | Source: Ambulatory Visit | Attending: Urology | Admitting: Urology

## 2014-10-03 DIAGNOSIS — Z01810 Encounter for preprocedural cardiovascular examination: Secondary | ICD-10-CM | POA: Diagnosis not present

## 2014-10-03 DIAGNOSIS — I10 Essential (primary) hypertension: Secondary | ICD-10-CM | POA: Insufficient documentation

## 2014-10-03 DIAGNOSIS — Z01812 Encounter for preprocedural laboratory examination: Secondary | ICD-10-CM | POA: Insufficient documentation

## 2014-10-03 HISTORY — DX: Nausea with vomiting, unspecified: R11.2

## 2014-10-03 HISTORY — DX: Hyperlipidemia, unspecified: E78.5

## 2014-10-03 HISTORY — DX: Nausea with vomiting, unspecified: Z98.890

## 2014-10-03 HISTORY — DX: Gastro-esophageal reflux disease without esophagitis: K21.9

## 2014-10-03 LAB — CBC
HCT: 44.6 % (ref 40.0–52.0)
HEMOGLOBIN: 14.7 g/dL (ref 13.0–18.0)
MCH: 27.9 pg (ref 26.0–34.0)
MCHC: 32.9 g/dL (ref 32.0–36.0)
MCV: 84.8 fL (ref 80.0–100.0)
Platelets: 114 10*3/uL — ABNORMAL LOW (ref 150–440)
RBC: 5.26 MIL/uL (ref 4.40–5.90)
RDW: 14.9 % — ABNORMAL HIGH (ref 11.5–14.5)
WBC: 3.8 10*3/uL (ref 3.8–10.6)

## 2014-10-03 LAB — URINALYSIS COMPLETE WITH MICROSCOPIC (ARMC ONLY)
BILIRUBIN URINE: NEGATIVE
Bacteria, UA: NONE SEEN
Glucose, UA: NEGATIVE mg/dL
HGB URINE DIPSTICK: NEGATIVE
KETONES UR: NEGATIVE mg/dL
Leukocytes, UA: NEGATIVE
Nitrite: NEGATIVE
PH: 6 (ref 5.0–8.0)
PROTEIN: NEGATIVE mg/dL
SQUAMOUS EPITHELIAL / LPF: NONE SEEN
Specific Gravity, Urine: 1.005 (ref 1.005–1.030)

## 2014-10-03 LAB — BASIC METABOLIC PANEL
Anion gap: 2 — ABNORMAL LOW (ref 5–15)
BUN: 19 mg/dL (ref 6–20)
CALCIUM: 9.5 mg/dL (ref 8.9–10.3)
CO2: 28 mmol/L (ref 22–32)
Chloride: 107 mmol/L (ref 101–111)
Creatinine, Ser: 0.92 mg/dL (ref 0.61–1.24)
GFR calc Af Amer: >60 mL/min (ref >60)
GFR calc non Af Amer: >60 mL/min (ref >60)
GLUCOSE: 103 mg/dL — AB (ref 65–99)
POTASSIUM: 4.6 mmol/L (ref 3.5–5.1)
Sodium: 137 mmol/L (ref 135–145)

## 2014-10-03 LAB — ABO/RH: ABO/RH(D): B POS

## 2014-10-03 NOTE — Patient Instructions (Addendum)
  Your procedure is scheduled on: Monday October 17, 2014. Report to Same Day Surgery. To find out your arrival time please call [phone] between 1PM - 3PM on Friday October 14, 2014.  Remember: Instructions that are not followed completely may result in serious medical risk, up to and including death, or upon the discretion of your surgeon and anesthesiologist your surgery may need to be rescheduled.    __x_ 1. Do not eat food or drink liquids after midnight. No gum chewing or hard candies.     __x__ 2. No Alcohol for 24 hours before or after surgery.   ____ 3. Bring all medications with you on the day of surgery if instructed.    __x__ 4. Notify your doctor if there is any change in your medical condition     (cold, fever, infections).     Do not wear jewelry, make-up, hairpins, clips or nail polish.  Do not wear lotions, powders, or perfumes. You may wear deodorant.  Do not shave 48 hours prior to surgery. Men may shave face and neck.  Do not bring valuables to the hospital.    [facility] is not responsible for any belongings or valuables.               Contacts, dentures or bridgework may not be worn into surgery.  Leave your suitcase in the car. After surgery it may be brought to your room.  For patients admitted to the hospital, discharge time is determined by your treatment team.   Patients discharged the day of surgery will not be allowed to drive home.    Please read over the following fact sheets that you were given:   [facility] Preparing for Surgery  _x___ Take these medicines the morning of surgery with A SIP OF WATER:   1. Protonix     ____ Fleet Enema (as directed)   _x___ Use CHG Soap as directed  ____ Use inhalers on the day of surgery  ____ Stop metformin 2 days prior to surgery    ____ Take 1/2 of usual insulin dose the night before surgery and none on the morning of surgery.   ____ Stop Coumadin/Plavix/aspirin on  _x__ Stop Anti-inflammatories  Meloxicam [facility]) June 20th, 2016.  ____ Stop supplements until after surgery.    ____ Bring C-Pap to the hospital.

## 2014-10-04 ENCOUNTER — Ambulatory Visit: Payer: Self-pay | Admitting: Urology

## 2014-10-05 LAB — URINE CULTURE: CULTURE: NO GROWTH

## 2014-10-16 NOTE — H&P (Signed)
Juan Jackson 09/12/2014 11:22 AM Location: [facility] Patient #: [id number] DOB: [birth year] Divorced / Language: Juan Jackson / Race: White Male    History of Present Illness(Juan Gelles [name], MD; 09/25/2014 7:27 PM) The patient is a 49 year old male who presents to the practice today for a transition into care. The patient is transitioning into care from another physician Juan Jackson, Juan Jackson) and a summary of care was reviewed .  Additional reason for visit:  Kidney canceris described as the following: Note for "Kidney cancer": 49 year old male with a incidental 17 millimeter LEFT lower pole enhancing renal mass concerning for possible malignancy.  He underwent a MRI of the lumbar spine approximately 6 months ago for chronic back pain and was found to have an incidental RIGHT renal mass measuring 9 mm which was considered indeterminate wtih follow-up imaging in 6 months recommended. Subsequent MRI obtained on 08/17/2014 of the abdomen with and without contrast showed that this lesion on the RIGHT side now measuring 10 mm represented a nonenhancing calcified renal lesion not particularly suspicious. He did however have a contralateral lesion as mentioned above concerning for malignancy.  He denies any history of renal cell carcinoma in his family. No history of flank pain or gross hematuria. No history of kidney stones.  He denies any urinary symptoms today.  No previous abdominal surgeries.  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 Nausea, Vomiting.    Family History(Juan Jackson;  09/12/2014 1:59 PM) Coronary Artery Disease. Mother. Diabetes Mellitus Type II. Mother. Ovarian Cancer. Mother. Prostate Cancer. Negative Family History Of. Renal Cancer. Father.    Social History(Juan Jackson; 09/12/2014 11:28 AM) Tobacco use. Former smoker. Alcohol use. Non-drinker. Illicit drug use. No    Travel History(Juan Jackson; 09/12/2014 2:00 PM) Have you traveled internationally in the last 21 days?. No.    Medication History(Juan Jackson; 09/12/2014 2:00 PM) Allopurinol (300MG  Tablet, 1 tab Oral daily) Active. Lisinopril-Hydrochlorothiazide (10-12.5MG  Tablet, 1 tab Oral daily) Active. Pantoprazole Sodium (40MG  Tablet DR, 1 tab Oral daily) Active. TiZANidine HCl (4MG  Tablet, 1 tab Oral two times daily) Active. Meloxicam (15MG  Tablet, 1 tab Oral daily) Active. Pravastatin Sodium (10MG  Tablet, 1 tab Oral at bedtime) Active. Medications Reconciled.    Past Surgical History(Juan Jackson; 09/12/2014 11:27 AM) Tonsillectomy Vasectomy    Review of Systems(Juan Jackson; 09/12/2014 1:57 PM) General:Present- Fatigue. Not Present- Chills, Fever and Weight Gain > 10lbs.. Skin:Not Present- Hair Loss, Pruritus and Rash. HEENT:Not Present- Eye Pain, Decreased Hearing, Runny Nose, Snoring and Dry Mucous Membranes. Neck:Not Present- Neck Pain and Swollen Glands. Respiratory:Not Present- Chronic Cough, Difficulty Breathing on Exertion and Wakes up from Sleep Wheezing or Short of Breath. Breast:Not Present- Gynecomastia. Cardiovascular:Not Present- Edema, Palpitations and Shortness of Breath. Gastrointestinal:Present- Constipationand Indigestion. Not Present- Diarrhea, Nausea and Vomiting. Male Genitourinary:Present- Frequency, Incontinenceand Nocturia. Note:see HPI Musculoskeletal:Present- Back Painand Joint Pain. Neurological:Not Present- Decreased Memory, Headaches and Seizures. Psychiatric:Not Present- Anxiety and  Depression. Endocrine:Not Present- Excessive Sweating, Heat Intolerance and Tired/Sluggish. Hematology:Not Present- Abnormal Bleeding, Anemia, Blood Transfusion and Enlarged Lymph Nodes.    Vitals(Juan Jackson; 09/12/2014 2:03 PM) 09/12/2014 2:01 PM Weight: 292.31 lb Height: 74 in Body Surface Area: 2.63 m Body Mass Index: 37.53  kg/m Pulse: 96 (Regular) BP: 151/90 (Sitting, Left Arm, Standard)     Physical Juan Ferrari, MD; 09/25/2014 7:28 PM) The physical exam findings are as follows:   General General Appearance- Well groomed and Consistent with stated age. Not in acute distress. Note: Presents to the office today with his son   Integumentary General Characteristics:Overall examination of the patient's skin reveals - no rashes and no suspicious lesions.   Head and Neck Head- normocephalic, atraumatic with no lesions or palpable masses.   Chest and Lung Exam Chest and lung exam reveals - normal excursion with symmetric chest walls, quiet, even and easy respiratory effort with no use of accessory muscles and on auscultation, normal breath sounds, no adventitious sounds and normal vocal resonance.   Cardiovascular Cardiovascular examination reveals - normal heart sounds, regular rate and rhythm with no murmurs and no digital clubbing, cyanosis, edema, increased warmth or tenderness.   Abdomen Inspection:Inspection of the abdomen reveals - No Hernias. Palpation/Percussion:Palpation and Percussion of the abdomen reveal - Soft, Non Tender and No Rebound tenderness. Note: protuberant, obese Bladder- Non-palpable. Kidney (Left):Other Characteristics- Non Tender. Kidney (Right):Other Characteristics- Non Tender.   Neurologic Neurologic evaluation reveals - alert and oriented x 3 with no impairment of recent or remote memory, normal attention span and ability to concentrate and able to name objects and repeat phrases.  Appropriate fund of knowledge . Coordination- Normal. Gait- Normal.   Neuropsychiatric The patient's mood and affect are described as - normal.   Lymphatic General Lymphatics Description- Normal .    Results(Juan Jackson [name], MD; 09/25/2014 7:35 PM) Labs Name Value Range Date URINALYSIS, AUTOMATED W/ MICRO (- [facility] -) (81001) Collected:09/12/2014 Specific Gravity <1.005 (L) 1.005-1.030 Collected:09/12/2014 pH 5.5 5.0-7.5 Collected:09/12/2014 Urine-Color Yellow Yellow Collected:09/12/2014 Appearance Clear Clear Collected:09/12/2014 WBC Esterase Negative Negative Collected:09/12/2014 Protein Negative Negative/Trace Collected:09/12/2014 Glucose Negative Negative Collected:09/12/2014 Ketones Negative Negative Collected:09/12/2014 Occult Blood Negative Negative Collected:09/12/2014 Bilirubin Negative Negative Collected:09/12/2014 Urobilinogen,Semi-Qn 0.2 mg/dL 0.2-1.0 mg/dL Collected:09/12/2014 Nitrite, Urine Negative Negative Collected:09/12/2014 Microscopic Examination See below: Collected:09/12/2014 Microscopic Examination Collected:09/12/2014 Microscopic Examination Collected:09/12/2014 WBC 0-5 /hpf 0 - 5 /hpf Collected:09/12/2014 RBC None seen /hpf 0 - 2 /hpf Collected:09/12/2014 Epithelial Cells (non renal) None seen /hpf 0 - 10 /hpf Collected:09/12/2014 Epithelial Cells (renal) None seen /hpf None seen /hpf Collected:09/12/2014 Casts Collected:09/12/2014 Cast Type Collected:09/12/2014 Crystals Collected:09/12/2014 Crystal Type Collected:09/12/2014 Mucus Threads Collected:09/12/2014 Bacteria None seen None seen/Few Collected:09/12/2014 Yeast Collected:09/12/2014 Trichomonas Collected:09/12/2014 Comment Collected:09/12/2014   Assessment & Plan(Janiece Scovill,  MD; 09/25/2014 7:35 PM) Left renal mass (593.9  N28.89) Story: - schedule LEFT robotic partial nephrectomy - preop labs/urine culture - recommend CT abdomen with and without contrast for surgical planning, (vessels) Impression: 49 year old male with enhancing 17 mm LEFT lower pole renal mass suspicious for renal cell carcinoma. He also has a contralateral lesion on the other side which is not as concerning,, calcified, nonenhancing 10 mm. A solid renal mass raises the suspicion of primary renal malignancy. We discussed this in detail and in regards to the spectrum of renal masses which includes cysts (pure cysts are considered benign), solid masses and everything in between. The risk of metastasis increases as the size of solid renal mass increases. In general, it is believed that the risk of metastasis for renal masses less than 3-4 cm is small (up to approximately 5%) based mainly on large retrospective studies. In some cases and especially in patients of older age and multiple comorbidities a surveillance approach may be appropriate. The treatment of solid renal masses includes: surveillance, cryoablation (  percutaneous and laparoscopic) in addition to partial and complete nephrectomy (each with option of laparoscopic, robotic and open depending on appropriateness). Furthermore, nephrectomy appears to be an independent risk factor for the development of chronic kidney disease suggesting that nephron sparing approaches should be implored whenever feasible. We reviewed these options in context of the patients current situation as well as the pros and cons of each. Imaging was reviewed today. I do feel that this lesion is amenable to partial nephrectomy. ddiscussed robotic partial nephrectomy today including all of the risks of the procedure including risk of bleeding, urinary leak, fistula formation, and along with damage to surrounding structures were discussed. Risks of general anesthesia along With oother  major complications including heart attack, stroke, clots, and even death were discussed. He was offered a second opiinion which he declined. He would like to proceed as planned. Current Plans l Pt Education - How to access health information online: discussed with patient and provided information. l URINALYSIS, AUTOMATED W/ MICRO (- [facility] -) (81001)  Obesity (278.00  E66.9) Impression: recommend weight loss prior to surgery if possible   Signed electronically by Hollice Espy, MD (09/25/2014 7:36 PM)

## 2014-10-17 ENCOUNTER — Inpatient Hospital Stay: Payer: Commercial Managed Care - HMO | Admitting: Anesthesiology

## 2014-10-17 ENCOUNTER — Encounter
Admission: RE | Disposition: A | Discharge status: Home / Self Care | Payer: Self-pay | Source: Ambulatory Visit | Attending: Urology

## 2014-10-17 ENCOUNTER — Inpatient Hospital Stay
Admission: RE | Admit: 2014-10-17 | Discharge: 2014-10-19 | DRG: 658 | Disposition: A | Discharge status: Home / Self Care | Payer: Commercial Managed Care - HMO | Source: Ambulatory Visit | Attending: Urology | Admitting: Urology

## 2014-10-17 ENCOUNTER — Encounter: Payer: Self-pay | Admitting: Anesthesiology

## 2014-10-17 DIAGNOSIS — Z791 Long term (current) use of non-steroidal anti-inflammatories (NSAID): Secondary | ICD-10-CM

## 2014-10-17 DIAGNOSIS — E785 Hyperlipidemia, unspecified: Secondary | ICD-10-CM | POA: Diagnosis present

## 2014-10-17 DIAGNOSIS — Z8042 Family history of malignant neoplasm of prostate: Secondary | ICD-10-CM

## 2014-10-17 DIAGNOSIS — M109 Gout, unspecified: Secondary | ICD-10-CM | POA: Diagnosis present

## 2014-10-17 DIAGNOSIS — Z8041 Family history of malignant neoplasm of ovary: Secondary | ICD-10-CM

## 2014-10-17 DIAGNOSIS — Z833 Family history of diabetes mellitus: Secondary | ICD-10-CM

## 2014-10-17 DIAGNOSIS — K219 Gastro-esophageal reflux disease without esophagitis: Secondary | ICD-10-CM | POA: Diagnosis present

## 2014-10-17 DIAGNOSIS — G709 Myoneural disorder, unspecified: Secondary | ICD-10-CM | POA: Diagnosis present

## 2014-10-17 DIAGNOSIS — Z8249 Family history of ischemic heart disease and other diseases of the circulatory system: Secondary | ICD-10-CM | POA: Diagnosis not present

## 2014-10-17 DIAGNOSIS — Z885 Allergy status to narcotic agent status: Secondary | ICD-10-CM

## 2014-10-17 DIAGNOSIS — Z6837 Body mass index (BMI) 37.0-37.9, adult: Secondary | ICD-10-CM

## 2014-10-17 DIAGNOSIS — Z8051 Family history of malignant neoplasm of kidney: Secondary | ICD-10-CM | POA: Diagnosis not present

## 2014-10-17 DIAGNOSIS — Z87891 Personal history of nicotine dependence: Secondary | ICD-10-CM

## 2014-10-17 DIAGNOSIS — N2889 Other specified disorders of kidney and ureter: Secondary | ICD-10-CM | POA: Diagnosis present

## 2014-10-17 DIAGNOSIS — R748 Abnormal levels of other serum enzymes: Secondary | ICD-10-CM | POA: Diagnosis present

## 2014-10-17 DIAGNOSIS — C642 Malignant neoplasm of left kidney, except renal pelvis: Secondary | ICD-10-CM | POA: Diagnosis not present

## 2014-10-17 DIAGNOSIS — E669 Obesity, unspecified: Secondary | ICD-10-CM | POA: Diagnosis present

## 2014-10-17 DIAGNOSIS — I10 Essential (primary) hypertension: Secondary | ICD-10-CM | POA: Diagnosis present

## 2014-10-17 DIAGNOSIS — Z79899 Other long term (current) drug therapy: Secondary | ICD-10-CM | POA: Diagnosis not present

## 2014-10-17 HISTORY — PX: ROBOTIC ASSITED PARTIAL NEPHRECTOMY: SHX6087

## 2014-10-17 LAB — PREPARE RBC (CROSSMATCH)

## 2014-10-17 SURGERY — ROBOTIC ASSITED PARTIAL NEPHRECTOMY
Anesthesia: General | Site: Abdomen | Laterality: Left | Wound class: Clean Contaminated

## 2014-10-17 MED ORDER — PRAVASTATIN SODIUM 10 MG PO TABS
10.0000 mg | ORAL_TABLET | Freq: Every day | ORAL | Status: DC
  Administered 2014-10-18: 10 mg via ORAL
  Filled 2014-10-17: qty 1

## 2014-10-17 MED ORDER — FENTANYL CITRATE (PF) 100 MCG/2ML IJ SOLN
INTRAMUSCULAR | Status: AC
  Administered 2014-10-17: 25 ug via INTRAVENOUS
  Filled 2014-10-17: qty 2

## 2014-10-17 MED ORDER — MANNITOL 25 % IV SOLN
INTRAVENOUS | Status: AC
  Filled 2014-10-17: qty 50

## 2014-10-17 MED ORDER — LACTATED RINGERS IV SOLN
INTRAVENOUS | Status: DC
  Administered 2014-10-17 (×2): via INTRAVENOUS

## 2014-10-17 MED ORDER — DIPHENHYDRAMINE HCL 12.5 MG/5ML PO ELIX: 12.5000 mg | ORAL_SOLUTION | Freq: Four times a day (QID) | ORAL | Status: DC | PRN

## 2014-10-17 MED ORDER — HYDROMORPHONE HCL 1 MG/ML IJ SOLN
0.5000 mg | INTRAMUSCULAR | Status: DC | PRN
  Administered 2014-10-17 – 2014-10-18 (×6): 1 mg via INTRAVENOUS
  Filled 2014-10-17 (×6): qty 1

## 2014-10-17 MED ORDER — BUPIVACAINE HCL (PF) 0.5 % IJ SOLN
INTRAMUSCULAR | Status: AC
  Filled 2014-10-17: qty 30

## 2014-10-17 MED ORDER — OXYCODONE-ACETAMINOPHEN 5-325 MG PO TABS
1.0000 | ORAL_TABLET | ORAL | Status: DC | PRN
  Administered 2014-10-17: 1 via ORAL
  Administered 2014-10-18: 2 via ORAL
  Filled 2014-10-17: qty 1
  Filled 2014-10-17: qty 2

## 2014-10-17 MED ORDER — DIPHENHYDRAMINE HCL 50 MG/ML IJ SOLN: 12.5000 mg | Freq: Four times a day (QID) | INTRAMUSCULAR | Status: DC | PRN

## 2014-10-17 MED ORDER — HYDROMORPHONE HCL 1 MG/ML IJ SOLN
INTRAMUSCULAR | Status: DC | PRN
  Administered 2014-10-17: 0.5 mg via INTRAVENOUS
  Administered 2014-10-17: .6 mg via INTRAVENOUS
  Administered 2014-10-17: .4 mg via INTRAVENOUS
  Administered 2014-10-17: .5 mg via INTRAVENOUS

## 2014-10-17 MED ORDER — BUPIVACAINE HCL 0.5 % IJ SOLN
INTRAMUSCULAR | Status: DC | PRN
  Administered 2014-10-17: 10 mL

## 2014-10-17 MED ORDER — LISINOPRIL-HYDROCHLOROTHIAZIDE 10-12.5 MG PO TABS: 1.0000 | ORAL_TABLET | Freq: Every morning | ORAL | Status: DC

## 2014-10-17 MED ORDER — LIDOCAINE HCL (PF) 1 % IJ SOLN
INTRAMUSCULAR | Status: AC
  Filled 2014-10-17: qty 2

## 2014-10-17 MED ORDER — ONDANSETRON HCL 4 MG/2ML IJ SOLN
4.0000 mg | Freq: Once | INTRAMUSCULAR | Status: AC | PRN
  Administered 2014-10-17: 4 mg via INTRAVENOUS

## 2014-10-17 MED ORDER — ONDANSETRON HCL 4 MG/2ML IJ SOLN
4.0000 mg | INTRAMUSCULAR | Status: DC | PRN
  Administered 2014-10-17 – 2014-10-18 (×3): 4 mg via INTRAVENOUS
  Filled 2014-10-17 (×3): qty 2

## 2014-10-17 MED ORDER — HYDROCHLOROTHIAZIDE 12.5 MG PO CAPS
12.5000 mg | ORAL_CAPSULE | Freq: Every day | ORAL | Status: DC
  Administered 2014-10-17 – 2014-10-19 (×3): 12.5 mg via ORAL
  Filled 2014-10-17 (×3): qty 1

## 2014-10-17 MED ORDER — FUROSEMIDE 10 MG/ML IJ SOLN
INTRAMUSCULAR | Status: DC | PRN
  Administered 2014-10-17: 10 mg via INTRAMUSCULAR

## 2014-10-17 MED ORDER — THROMBIN 5000 UNITS EX SOLR
CUTANEOUS | Status: DC | PRN
  Administered 2014-10-17: 5000 [IU] via TOPICAL

## 2014-10-17 MED ORDER — LIDOCAINE HCL (CARDIAC) 20 MG/ML IV SOLN
INTRAVENOUS | Status: DC | PRN
  Administered 2014-10-17: 100 mg via INTRAVENOUS

## 2014-10-17 MED ORDER — ACETAMINOPHEN 10 MG/ML IV SOLN
INTRAVENOUS | Status: DC | PRN
  Administered 2014-10-17: 1000 mg via INTRAVENOUS

## 2014-10-17 MED ORDER — ROCURONIUM BROMIDE 100 MG/10ML IV SOLN
INTRAVENOUS | Status: DC | PRN
  Administered 2014-10-17: 30 mg via INTRAVENOUS
  Administered 2014-10-17 (×3): 10 mg via INTRAVENOUS
  Administered 2014-10-17: 20 mg via INTRAVENOUS
  Administered 2014-10-17 (×2): 10 mg via INTRAVENOUS
  Administered 2014-10-17: 30 mg via INTRAVENOUS
  Administered 2014-10-17 (×3): 10 mg via INTRAVENOUS

## 2014-10-17 MED ORDER — EVICEL 5 ML EX KIT
PACK | CUTANEOUS | Status: DC | PRN
  Administered 2014-10-17: 5 mL

## 2014-10-17 MED ORDER — SODIUM CHLORIDE 0.9 % IV SOLN
INTRAVENOUS | Status: DC
  Administered 2014-10-17 – 2014-10-19 (×4): via INTRAVENOUS

## 2014-10-17 MED ORDER — FENTANYL CITRATE (PF) 100 MCG/2ML IJ SOLN
25.0000 ug | INTRAMUSCULAR | Status: AC | PRN
Start: 2014-10-17 — End: 2014-10-17
  Administered 2014-10-17 (×6): 25 ug via INTRAVENOUS

## 2014-10-17 MED ORDER — ALLOPURINOL 300 MG PO TABS
300.0000 mg | ORAL_TABLET | Freq: Every evening | ORAL | Status: DC
  Administered 2014-10-18: 300 mg via ORAL
  Filled 2014-10-17: qty 1

## 2014-10-17 MED ORDER — FENTANYL CITRATE (PF) 100 MCG/2ML IJ SOLN
25.0000 ug | INTRAMUSCULAR | Status: AC | PRN
  Administered 2014-10-17 (×2): 50 ug via INTRAVENOUS
  Administered 2014-10-17: 100 ug via INTRAVENOUS
  Administered 2014-10-17 (×2): 50 ug via INTRAVENOUS
  Administered 2014-10-17: 100 ug via INTRAVENOUS
  Administered 2014-10-17: 50 ug via INTRAVENOUS

## 2014-10-17 MED ORDER — SUCCINYLCHOLINE CHLORIDE 20 MG/ML IJ SOLN
INTRAMUSCULAR | Status: DC | PRN
  Administered 2014-10-17: 100 mg via INTRAVENOUS

## 2014-10-17 MED ORDER — PHENYLEPHRINE HCL 10 MG/ML IJ SOLN
INTRAMUSCULAR | Status: DC | PRN
  Administered 2014-10-17: 100 ug via INTRAVENOUS

## 2014-10-17 MED ORDER — ACETAMINOPHEN 10 MG/ML IV SOLN
INTRAVENOUS | Status: AC
  Filled 2014-10-17: qty 100

## 2014-10-17 MED ORDER — TIZANIDINE HCL 4 MG PO TABS: 4.0000 mg | ORAL_TABLET | Freq: Two times a day (BID) | ORAL | Status: DC | PRN

## 2014-10-17 MED ORDER — MIDAZOLAM HCL 2 MG/2ML IJ SOLN
INTRAMUSCULAR | Status: DC | PRN
  Administered 2014-10-17: 2 mg via INTRAVENOUS

## 2014-10-17 MED ORDER — DEXAMETHASONE SODIUM PHOSPHATE 4 MG/ML IJ SOLN
INTRAMUSCULAR | Status: DC | PRN
  Administered 2014-10-17: 5 mg via INTRAVENOUS

## 2014-10-17 MED ORDER — SUGAMMADEX SODIUM 200 MG/2ML IV SOLN
INTRAVENOUS | Status: DC | PRN
  Administered 2014-10-17: 300 mg via INTRAVENOUS
  Administered 2014-10-17: 100 mg via INTRAVENOUS

## 2014-10-17 MED ORDER — PROPOFOL 10 MG/ML IV BOLUS
INTRAVENOUS | Status: DC | PRN
  Administered 2014-10-17: 50 mg via INTRAVENOUS
  Administered 2014-10-17: 150 mg via INTRAVENOUS

## 2014-10-17 MED ORDER — DEXMEDETOMIDINE HCL 200 MCG/2ML IV SOLN
INTRAVENOUS | Status: DC | PRN
  Administered 2014-10-17: 8 ug via INTRAVENOUS

## 2014-10-17 MED ORDER — CEFAZOLIN SODIUM-DEXTROSE 2-3 GM-% IV SOLR: 2.0000 g | Freq: Once | INTRAVENOUS | Status: DC

## 2014-10-17 MED ORDER — MANNITOL 25 % IV SOLN
INTRAVENOUS | Status: DC | PRN
  Administered 2014-10-17 (×2): 50 mL via INTRAVENOUS

## 2014-10-17 MED ORDER — CEFAZOLIN SODIUM-DEXTROSE 2-3 GM-% IV SOLR
INTRAVENOUS | Status: AC
  Administered 2014-10-17 (×2): 2 g via INTRAVENOUS
  Filled 2014-10-17: qty 50

## 2014-10-17 MED ORDER — HEPARIN SODIUM (PORCINE) 5000 UNIT/ML IJ SOLN
5000.0000 [IU] | Freq: Three times a day (TID) | INTRAMUSCULAR | Status: DC
  Administered 2014-10-17 – 2014-10-19 (×6): 5000 [IU] via SUBCUTANEOUS
  Filled 2014-10-17 (×7): qty 1

## 2014-10-17 MED ORDER — CEFAZOLIN SODIUM 1-5 GM-% IV SOLN
1.0000 g | Freq: Three times a day (TID) | INTRAVENOUS | Status: AC
  Administered 2014-10-17 – 2014-10-18 (×2): 1 g via INTRAVENOUS
  Filled 2014-10-17 (×2): qty 50

## 2014-10-17 MED ORDER — DOCUSATE SODIUM 100 MG PO CAPS
100.0000 mg | ORAL_CAPSULE | Freq: Two times a day (BID) | ORAL | Status: DC
  Administered 2014-10-17 – 2014-10-19 (×5): 100 mg via ORAL
  Filled 2014-10-17 (×5): qty 1

## 2014-10-17 MED ORDER — ACETAMINOPHEN 325 MG PO TABS
650.0000 mg | ORAL_TABLET | ORAL | Status: DC | PRN
  Administered 2014-10-18: 650 mg via ORAL
  Filled 2014-10-17: qty 2

## 2014-10-17 MED ORDER — THROMBIN 5000 UNITS EX SOLR
CUTANEOUS | Status: AC
  Filled 2014-10-17: qty 5000

## 2014-10-17 MED ORDER — LISINOPRIL 10 MG PO TABS
10.0000 mg | ORAL_TABLET | Freq: Every day | ORAL | Status: DC
  Administered 2014-10-17 – 2014-10-19 (×3): 10 mg via ORAL
  Filled 2014-10-17 (×3): qty 1

## 2014-10-17 MED ORDER — PANTOPRAZOLE SODIUM 40 MG PO TBEC
40.0000 mg | DELAYED_RELEASE_TABLET | Freq: Every day | ORAL | Status: DC
  Administered 2014-10-18 – 2014-10-19 (×2): 40 mg via ORAL
  Filled 2014-10-17 (×3): qty 1

## 2014-10-17 MED ORDER — OXYBUTYNIN CHLORIDE 5 MG PO TABS: 5.0000 mg | ORAL_TABLET | Freq: Three times a day (TID) | ORAL | Status: DC | PRN

## 2014-10-17 MED ORDER — SODIUM CHLORIDE 0.9 % IJ SOLN
INTRAMUSCULAR | Status: AC
  Filled 2014-10-17: qty 3

## 2014-10-17 SURGICAL SUPPLY — 97 items
ANCHOR TIS RET SYS 235ML (MISCELLANEOUS) ×3 IMPLANT
APPLICATOR SURGIFLO (MISCELLANEOUS) ×3 IMPLANT
BLADE CLIPPER SURG (BLADE) ×3 IMPLANT
BULB RESERV EVAC DRAIN JP 100C (MISCELLANEOUS) ×3 IMPLANT
CANISTER SUCT 1200ML W/VALVE (MISCELLANEOUS) ×3 IMPLANT
CHLORAPREP W/TINT 26ML (MISCELLANEOUS) ×6 IMPLANT
CLIP LIGATING HEM O LOK PURPLE (MISCELLANEOUS) ×9 IMPLANT
CLIP LIGATING HEMO O LOK GREEN (MISCELLANEOUS) IMPLANT
CLIP SUT LAPRA TY ABSORB (SUTURE) ×12 IMPLANT
CORD BIP STRL DISP 12FT (MISCELLANEOUS) ×3 IMPLANT
COVER TIP SHEARS 8 DVNC (MISCELLANEOUS) ×1 IMPLANT
COVER TIP SHEARS 8MM DA VINCI (MISCELLANEOUS) ×2
DEFOGGER SCOPE WARMER CLEARIFY (MISCELLANEOUS) ×3 IMPLANT
DRAIN CHANNEL JP 19F (MISCELLANEOUS) ×3 IMPLANT
DRAPE SHEET LG 3/4 BI-LAMINATE (DRAPES) ×6 IMPLANT
DRAPE SURG 17X11 SM STRL (DRAPES) ×12 IMPLANT
DRIVER LRG NEEDLE DA VINCI (INSTRUMENTS) ×2
DRIVER NDLE LRG DVNC (INSTRUMENTS) ×1 IMPLANT
DRSG TEGADERM 2-3/8X2-3/4 SM (GAUZE/BANDAGES/DRESSINGS) ×15 IMPLANT
ELECT PAD DSPR THERM+ ADLT (MISCELLANEOUS) IMPLANT
EVICEL 5ML ×3 IMPLANT
EVICEL 5ML SEALNT HUMAN B (Miscellaneous) ×3 IMPLANT
FILTER LAP SMOKE EVAC STRL (MISCELLANEOUS) ×3 IMPLANT
GAUZE SPONGE NON-WVN 2X2 STRL (MISCELLANEOUS) ×3 IMPLANT
GLOVE BIO SURGEON STRL SZ 6.5 (GLOVE) ×14 IMPLANT
GLOVE BIO SURGEONS STRL SZ 6.5 (GLOVE) ×7
GLOVE BIOGEL PI IND STRL 6.5 (GLOVE) ×5 IMPLANT
GLOVE BIOGEL PI INDICATOR 6.5 (GLOVE) ×10
GOWN STRL REUS W/ TWL LRG LVL3 (GOWN DISPOSABLE) ×7 IMPLANT
GOWN STRL REUS W/TWL LRG LVL3 (GOWN DISPOSABLE) ×14
GRASPER SUT TROCAR 14GX15 (MISCELLANEOUS) ×3 IMPLANT
HEMOSTAT SURGICEL 2X14 (HEMOSTASIS) ×9 IMPLANT
IRRIGATION STRYKERFLOW (MISCELLANEOUS) ×1 IMPLANT
IRRIGATOR STRYKERFLOW (MISCELLANEOUS) ×3
IV LACTATED RINGERS 1000ML (IV SOLUTION) IMPLANT
IV NS 1000ML (IV SOLUTION) ×2
IV NS 1000ML BAXH (IV SOLUTION) ×1 IMPLANT
KIT ACCESSORY DA VINCI DISP (KITS) ×2
KIT ACCESSORY DVNC DISP (KITS) ×1 IMPLANT
KIT RM TURNOVER STRD PROC AR (KITS) ×3 IMPLANT
LABEL OR SOLS (LABEL) ×3 IMPLANT
LIQUID BAND (GAUZE/BANDAGES/DRESSINGS) ×3 IMPLANT
LOOP RED MAXI  1X406MM (MISCELLANEOUS) ×2
LOOP VESSEL MAXI 1X406 RED (MISCELLANEOUS) ×1 IMPLANT
NDL SAFETY 18GX1.5 (NEEDLE) ×3 IMPLANT
NDL SAFETY 25GX1.5 (NEEDLE) ×3 IMPLANT
NDL SAFETY ECLIPSE 18X1.5 (NEEDLE) ×2 IMPLANT
NEEDLE HYPO 18GX1.5 SHARP (NEEDLE) ×4
NEEDLE INSUFFLATION 14GA 120MM (NEEDLE) ×3 IMPLANT
NS IRRIG 500ML POUR BTL (IV SOLUTION) ×3 IMPLANT
PACK LAP CHOLECYSTECTOMY (MISCELLANEOUS) ×3 IMPLANT
PAD GROUND ADULT SPLIT (MISCELLANEOUS) ×3 IMPLANT
PAD TRENDELENBURG OR TABLE (MISCELLANEOUS) IMPLANT
PENCIL ELECTRO HAND CTR (MISCELLANEOUS) ×3 IMPLANT
PROBE ULTRASOUND PROART (MISCELLANEOUS) ×3 IMPLANT
PROGRASP ENDOWRIST DA VINCI (INSTRUMENTS) ×2
PROGRASP ENDOWRIST DVNC (INSTRUMENTS) ×1 IMPLANT
SCISSORS METZENBAUM CVD 33 (INSTRUMENTS) IMPLANT
SLEEVE ENDOPATH XCEL 5M (ENDOMECHANICALS) ×6 IMPLANT
SOLUTION ELECTROLUBE (MISCELLANEOUS) ×3 IMPLANT
SPOGE SURGIFLO 8M (HEMOSTASIS) ×2
SPONGE EXCIL AMD DRAIN 4X4 6P (MISCELLANEOUS) ×3 IMPLANT
SPONGE SURGIFLO 8M (HEMOSTASIS) ×1 IMPLANT
SPONGE VERSALON 2X2 STRL (MISCELLANEOUS) ×6
STAPLE RELOAD 2.5MM WHITE (STAPLE) ×6 IMPLANT
STAPLER SKIN PROX 35W (STAPLE) ×3 IMPLANT
STAPLER VASCULAR ECHELON 35 (CUTTER) ×3 IMPLANT
STRAP SAFETY BODY (MISCELLANEOUS) ×9 IMPLANT
SUT DVC V-LOC 4-0 90 CLR P-12 (SUTURE) ×3
SUT ETHILON 3-0 FS-10 30 BLK (SUTURE) ×3
SUT MNCRL AB 4-0 PS2 18 (SUTURE) ×12 IMPLANT
SUT PDS PLUS 0 (SUTURE)
SUT PDS PLUS AB 0 CT-2 (SUTURE) IMPLANT
SUT PROLENE 5 0 PS 3 (SUTURE) ×3 IMPLANT
SUT PROLENE 5-0 (SUTURE) ×2
SUT PROLENE 5-0 BB 24X2 ARM (SUTURE) ×1
SUT VIC AB 0 CT1 36 (SUTURE) ×6 IMPLANT
SUT VIC AB 0 CT2 27 (SUTURE) ×3 IMPLANT
SUT VIC AB 2-0 SH 27 (SUTURE) ×10
SUT VIC AB 2-0 SH 27XBRD (SUTURE) ×5 IMPLANT
SUT VICRYL 0 AB UR-6 (SUTURE) ×3 IMPLANT
SUT VICRYL PLUS ABS 0 54 (SUTURE) ×3 IMPLANT
SUTURE DVC V-LC4-0 90 CLR P-12 (SUTURE) ×1 IMPLANT
SUTURE EHLN 3-0 FS-10 30 BLK (SUTURE) ×1 IMPLANT
SUTURE PROLEN 5-0 BB 24X2 ARM (SUTURE) ×1 IMPLANT
SYR 3ML LL SCALE MARK (SYRINGE) IMPLANT
SYRINGE 10CC LL (SYRINGE) ×6 IMPLANT
TAPE CLOTH 10X20 WHT NS LF (TAPE) ×2 IMPLANT
TAPE CLOTH 2X10 WHT NS LF (TAPE) ×4
TIP RIGID 35CM EVICEL (HEMOSTASIS) ×3 IMPLANT
TROCAR 12M 150ML BLUNT (TROCAR) ×3 IMPLANT
TROCAR DISP BLADELESS 8 DVNC (TROCAR) ×1 IMPLANT
TROCAR DISP BLADELESS 8MM (TROCAR) ×2
TROCAR ENDOPATH XCEL 12X100 BL (ENDOMECHANICALS) ×3 IMPLANT
TROCAR XCEL 12X100 BLDLESS (ENDOMECHANICALS) ×3 IMPLANT
TROCAR XCEL NON-BLD 5MMX100MML (ENDOMECHANICALS) ×3 IMPLANT
TUBING INSUFFLATOR HI FLOW (MISCELLANEOUS) ×3 IMPLANT

## 2014-10-17 NOTE — Op Note (Signed)
PREOPERATIVE DIAGNOSIS: Left renal mass    POSTOPERATIVE DIAGNOSIS: Left renal mass    OPERATION PERFORMED: 1. Robotic assisted laparoscopic left partial nephrectomy 2. Intraoperative ultrasound with interpretation.  SURGEON: Hollice Espy, MD.  Physician Assistant: Link Snuffer  FINDINGS: Hilar anatomy: 1 arteries. 1 veins. 2.2 cm solid renal mass  Warm ischemia time: 22 min.  SPECIMENS: 1. Left renal mass  BLOOD LOSS: 100 cc  ANESTHESIA: General.  COMPLICATIONS: None.  DRAINS: 16-French Foley catheter. 19 Round JP  INDICATIONS FOR OPERATION: Left renal mass  DESCRIPTION OF OPERATION: Informed consent was obtained. The patient was marked on the left side and then taken to the operating room, placed supine on the operating table. General anesthesia was provided. SCDs were provided for DVT prophylaxis and IV antibiotics for bacterial prophylaxis. Foley catheter was placed to drain the bladder. OG tube was placed by anesthesia. The patient was then positioned in right lateral decubitus position with the left flank elevated about 70 degrees. All pressure points were carefully padded. The table was flexed slightly. The left arm was placed in a padded support. The patient was secured to the table with soft chest, hip, and lower extremity straps. The patient was prepped and draped in usual sterile fashion. We had a time-out confirming the patient identification, the planned procedure, the surgical site, and all present were in agreement.  A Veress needle was placed just lateral to the left rectus belly at the level of the umbilicus. Aspiration, irrigation, and saline drop test confirmed good position. Opening pressure was less than 9 mmHg. The abdomen was insufflated to 15 mmHg. Following this, trocar sites were mapped out with two robotic trocars triangulating the expected location of the tumor, a 12 mm trocar between, at about the midclavicular line, for  the camera, 12 mm midline trocars and a 5 mm for the assistant, and an 47mm trocar in the left lower quadrant for the 4th arm.  I used a visual obturator to place a 5 mm trocar at one of the robotic port sites, and the peritoneal cavity was surveyed with no abnormalities or injuries. Remaining trocars were placed under laparoscopic visualization. I used a Minette Headland device to place 0 Vicryl sutures at the 12 mm trocar sites for closure at the end of the case. The robot was docked. I placed monopolar shears in the right arm, [place] bipolar in the left, and a double fenestrated grasper in the 4th arm.   The white line of Toldt was incised and the right colon was reflected. The tail of Gerota's fascia was lifted up and the ureter and gonadal vein were identified. The gonadal vein was left medial and the ureter was lifted up. The psoas muscle was identified under the ureter and I followed this plane towards the renal hilum. The hilar vessels were identified and exposed Circumferentially.  Of note, there were several early branches of the artery which was traced back to the primary branch after very careful dissection.     At this point, I incised Gerota's fascia and defatted the kidney to expose the mass. Once the tumor was exposed, intraoperative ultrasound was performed. The  mass was imaged and measured, and then excision markings were made.  Measurements were consistent with mass seen on CT scan.  12.5 gm of mannitol and 10 mg IV lasix were given, and then bulldog clamps were placed on the renal artery x 2. Vein was NOT clamped. Warm ischemia time was clocked. The renal mass was sharply excised  and immediately placed in a 10 mm endocatch bag. There was a small area (1 mm) where there was a question of possible capsular violation and this area was excised in the renal tumor bed and sent for frozen section which was NEGATIVE for tumor. The tumor bed was otherwise grossly negative for  tumor. The first layer of the renorrhaphy was closed with running V-Loc suture, anchored at each end with a Lapra-Ty. A sliding clip renorrhaphy was performed to close the outer layer, using interrupted 2-0 vicryl suture, Weck clips, and Lapra-Ty's. The capsule edges were nicely approximated. The bulldog clamps were removed. Warm ischemia time was 22 minutes. Additional 12.5 gm of mannitol was given. All needles were extracted. The kidney was well perfused and the renorrhaphy was hemostatic.  Surgicel/ Tisseal was used over the bed of the tumor for additional hemostasis. Gerota's was reapproximated using a running 2-0 vicryl suture.    Floseal was placed around the hilum. Hemostasis was achieved.  A 19 round JP drain was placed lateral to the kidney. I reduced the pneumoperitoneal pressure to 7 mmHg and confirmed hemostasis throughout the surgical field. Trocars were removed under direct vision. The tumors were extracted and sent for pathology. Margins were grossly clear and a small stitch was placed at the site where the underlying frozen section was performed. At the 12 mm trocar sites, the 0 Vicryl sutures that had been placed at the start of the case were tied down securely.   All the wounds were copiously irrigated and then infiltrated with local anesthetic. The skin edges were re approximated with 4-0 Monocryl. Dermabond was applied.   All sponge, needle, and instrument counts were reported correct. The patient was awakened from anesthesia and transferred to recovery in stable condition. There were no complications and the patient tolerated the procedure well. Operative events were discussed with the patient's family.

## 2014-10-17 NOTE — Brief Op Note (Signed)
10/17/2014  1:57 PM  PATIENT:  Juan Jackson  49 y.o. male  PRE-OPERATIVE DIAGNOSIS: Left renal mass   POST-OPERATIVE DIAGNOSIS:  Left renal mass  PROCEDURE:  Procedure(s): ROBOTIC ASSITED PARTIAL NEPHRECTOMY (Left), intraoperative laparoscopic ultrasound  SURGEON:  Surgeon(s) and Role:    * Hollice Espy, MD - Primary  ASSISTANTS: Link Snuffer, PA   ANESTHESIA:   general  EBL:  Total I/O In: 2600 [I.V.:2600] Out: 950 [Urine:850; Blood:100]  Drains: 16 Fr Foley and JP drain  Specimen: left renal mass  COUNTS CORRECT: YES  PLAN OF CARE: Admit to inpatient   PATIENT DISPOSITION:  PACU - hemodynamically stable.

## 2014-10-17 NOTE — Anesthesia Preprocedure Evaluation (Addendum)
Anesthesia Evaluation  Patient identified by MRN, date of birth, ID band Patient awake    Reviewed: Allergy & Precautions, NPO status , Patient's Chart, lab work & pertinent test results  History of Anesthesia Complications (+) PONV  Airway Mallampati: III  TM Distance: >3 FB     Dental  (+) Chipped   Pulmonary former smoker,          Cardiovascular hypertension,     Neuro/Psych  Neuromuscular disease    GI/Hepatic GERD-  ,  Endo/Other    Renal/GU      Musculoskeletal   Abdominal   Peds  Hematology   Anesthesia Other Findings Gout. Renal cell ca. Maybe difficult to intubate. Glide, Mcgrath. Allens test ok bilterally.  Reproductive/Obstetrics                            Anesthesia Physical Anesthesia Plan  ASA: III  Anesthesia Plan: General   Post-op Pain Management:    Induction: Intravenous  Airway Management Planned: Oral ETT  Additional Equipment:   Intra-op Plan:   Post-operative Plan:   Informed Consent: I have reviewed the patients History and Physical, chart, labs and discussed the procedure including the risks, benefits and alternatives for the proposed anesthesia with the patient or authorized representative who has indicated his/her understanding and acceptance.     Plan Discussed with: CRNA  Anesthesia Plan Comments:         Anesthesia Quick Evaluation

## 2014-10-17 NOTE — Transfer of Care (Signed)
Immediate Anesthesia Transfer of Care Note  Patient: Juan Jackson  Procedure(s) Performed: Procedure(s): ROBOTIC ASSITED PARTIAL NEPHRECTOMY (Left)  Patient Location: PACU  Anesthesia Type:General  Level of Consciousness: awake, alert  and oriented  Airway & Oxygen Therapy: Patient connected to face mask oxygen  Post-op Assessment: Report given to RN and Post -op Vital signs reviewed and stable  Post vital signs: Reviewed and stable  Last Vitals:  Filed Vitals:   10/17/14 0629  BP: 140/90  Pulse: 91  Temp: 36.7 C  Resp: 16    Complications: No apparent anesthesia complications

## 2014-10-17 NOTE — OR Nursing (Signed)
Clamped time:22 Minutes

## 2014-10-17 NOTE — Interval H&P Note (Signed)
History and Physical Interval Note:  10/17/2014 7:27 AM  Juan Jackson  has presented today for surgery, with the diagnosis of RENAL CELL CARCINOMA  The various methods of treatment have been discussed with the patient and family. After consideration of risks, benefits and other options for treatment, the patient has consented to  Procedure(s): ROBOTIC ASSITED PARTIAL NEPHRECTOMY (Left) as a surgical intervention .  The patient's history has been reviewed, patient examined, no change in status, stable for surgery.  I have reviewed the patient's chart and labs.  Questions were answered to the patient's satisfaction.    RRR CTAB  Site marked on left side.  CT scan reviewed.   Hollice Espy

## 2014-10-17 NOTE — Anesthesia Procedure Notes (Signed)
Procedure Name: Intubation Date/Time: 10/17/2014 7:58 AM Performed by: Justus Memory Pre-anesthesia Checklist: Patient identified, Emergency Drugs available, Suction available and Patient being monitored Patient Re-evaluated:Patient Re-evaluated prior to inductionOxygen Delivery Method: Circle system utilized Preoxygenation: Pre-oxygenation with 100% oxygen Intubation Type: IV induction and Cricoid Pressure applied Ventilation: Mask ventilation with difficulty and Two handed mask ventilation required Laryngoscope Size: Mac and 4 Grade View: Grade II Tube type: Oral Number of attempts: 1 Airway Equipment and Method: Patient positioned with wedge pillow and Stylet Placement Confirmation: ETT inserted through vocal cords under direct vision,  positive ETCO2,  CO2 detector and breath sounds checked- equal and bilateral Secured at: 21 cm Tube secured with: Tape Dental Injury: Teeth and Oropharynx as per pre-operative assessment  Difficulty Due To: Difficulty was anticipated Future Recommendations: Recommend- induction with short-acting agent, and alternative techniques readily available

## 2014-10-17 NOTE — Anesthesia Postprocedure Evaluation (Signed)
  Anesthesia Post-op Note  Patient: Juan Jackson  Procedure(s) Performed: Procedure(s): ROBOTIC ASSITED PARTIAL NEPHRECTOMY (Left)  Anesthesia type:General  Patient location: PACU  Post pain: Pain level controlled  Post assessment: Post-op Vital signs reviewed, Patient's Cardiovascular Status Stable, Respiratory Function Stable, Patent Airway and No signs of Nausea or vomiting  Post vital signs: Reviewed and stable  Last Vitals:  Filed Vitals:   10/17/14 1452  BP:   Pulse: 103  Temp: 36.6 C  Resp: 13    Level of consciousness: awake, alert  and patient cooperative  Complications: No apparent anesthesia complications

## 2014-10-18 ENCOUNTER — Encounter: Payer: Self-pay | Admitting: Urology

## 2014-10-18 LAB — BASIC METABOLIC PANEL
Anion gap: 8 (ref 5–15)
BUN: 21 mg/dL — ABNORMAL HIGH (ref 6–20)
CO2: 25 mmol/L (ref 22–32)
Calcium: 8.8 mg/dL — ABNORMAL LOW (ref 8.9–10.3)
Chloride: 101 mmol/L (ref 101–111)
Creatinine, Ser: 1.08 mg/dL (ref 0.61–1.24)
GFR calc Af Amer: >60 mL/min (ref >60)
GFR calc non Af Amer: >60 mL/min (ref >60)
Glucose, Bld: 126 mg/dL — ABNORMAL HIGH (ref 65–99)
POTASSIUM: 4.4 mmol/L (ref 3.5–5.1)
SODIUM: 134 mmol/L — AB (ref 135–145)

## 2014-10-18 LAB — CREATININE, FLUID (PLEURAL, PERITONEAL, JP DRAINAGE): CREAT FL: 1.1 mg/dL

## 2014-10-18 LAB — TYPE AND SCREEN
ABO/RH(D): B POS
ANTIBODY SCREEN: NEGATIVE
UNIT DIVISION: 0
Unit division: 0

## 2014-10-18 LAB — CBC
HEMATOCRIT: 40.7 % (ref 40.0–52.0)
Hemoglobin: 13.1 g/dL (ref 13.0–18.0)
MCH: 28.3 pg (ref 26.0–34.0)
MCHC: 32.3 g/dL (ref 32.0–36.0)
MCV: 87.6 fL (ref 80.0–100.0)
PLATELETS: 114 10*3/uL — AB (ref 150–440)
RBC: 4.64 MIL/uL (ref 4.40–5.90)
RDW: 14.5 % (ref 11.5–14.5)
WBC: 9.9 10*3/uL (ref 3.8–10.6)

## 2014-10-18 MED ORDER — BISACODYL 10 MG RE SUPP
10.0000 mg | Freq: Once | RECTAL | Status: AC
  Administered 2014-10-19: 10 mg via RECTAL
  Filled 2014-10-18: qty 1

## 2014-10-18 MED ORDER — BISACODYL 10 MG RE SUPP
10.0000 mg | Freq: Once | RECTAL | Status: AC
  Administered 2014-10-18: 10 mg via RECTAL
  Filled 2014-10-18: qty 1

## 2014-10-18 MED ORDER — HYDROMORPHONE HCL 2 MG PO TABS
2.0000 mg | ORAL_TABLET | ORAL | Status: DC | PRN
  Administered 2014-10-18: 2 mg via ORAL
  Administered 2014-10-18 – 2014-10-19 (×3): 4 mg via ORAL
  Administered 2014-10-19: 2 mg via ORAL
  Administered 2014-10-19: 4 mg via ORAL
  Filled 2014-10-18 (×3): qty 2
  Filled 2014-10-18: qty 1
  Filled 2014-10-18: qty 2
  Filled 2014-10-18: qty 1

## 2014-10-18 NOTE — Progress Notes (Signed)
1 Day Post-Op Subjective: The patient is doing well.  No nausea or vomiting. Pain is adequately controlled.  Not yet oob.  Tolerating clears.    Objective: Vital signs in last 24 hours: Temp:  [96.8 F (36 C)-98.7 F (37.1 C)] 98.2 F (36.8 C) (06/28 0740) Pulse Rate:  [81-124] 86 (06/28 0740) Resp:  [12-20] 18 (06/28 0740) BP: (111-154)/(70-104) 129/80 mmHg (06/28 0740) SpO2:  [93 %-100 %] 99 % (06/28 0740) Arterial Line BP: (113-137)/(72-107) 121/74 mmHg (06/27 1430) Weight:  [288 lb 12.8 oz (131 kg)] 288 lb 12.8 oz (131 kg) (06/27 1606)  Intake/Output from previous day: 06/27 0701 - 06/28 0700 In: 4413 [I.V.:4223] Out: 2170 [Urine:1920; Drains:150; Blood:100] Intake/Output this shift: Total I/O In: 353.8 [I.V.:353.8] Out: 185 [Urine:175; Drains:10]  Physical Exam:  General: Alert and oriented. CV: RRR Lungs: Clear bilaterally. GI: Soft, Nondistended. Incisions: Clean and dry. Urine: Clear, Foley in place Extremities: Nontender, no erythema, no edema.  Lab Results:  Recent Labs  10/18/14 0412  HGB 13.1  HCT 40.7          Recent Labs  10/18/14 0412  CREATININE 1.08           Results for orders placed or performed during the hospital encounter of 10/17/14 (from the past 24 hour(s))  CBC     Status: Abnormal   Collection Time: 10/18/14  4:12 AM  Result Value Ref Range   WBC 9.9 3.8 - 10.6 K/uL   RBC 4.64 4.40 - 5.90 MIL/uL   Hemoglobin 13.1 13.0 - 18.0 g/dL   HCT 40.7 40.0 - 52.0 %   MCV 87.6 80.0 - 100.0 fL   MCH 28.3 26.0 - 34.0 pg   MCHC 32.3 32.0 - 36.0 g/dL   RDW 14.5 11.5 - 14.5 %   Platelets 114 (L) 150 - 440 K/uL  Basic metabolic panel     Status: Abnormal   Collection Time: 10/18/14  4:12 AM  Result Value Ref Range   Sodium 134 (L) 135 - 145 mmol/L   Potassium 4.4 3.5 - 5.1 mmol/L   Chloride 101 101 - 111 mmol/L   CO2 25 22 - 32 mmol/L   Glucose, Bld 126 (H) 65 - 99 mg/dL   BUN 21 (H) 6 - 20 mg/dL   Creatinine, Ser 1.08 0.61 - 1.24  mg/dL   Calcium 8.8 (L) 8.9 - 10.3 mg/dL   GFR calc non Af Amer >60 >60 mL/min   GFR calc Af Amer >60 >60 mL/min   Anion gap 8 5 - 15    Assessment/Plan: POD# 1 s/p robotic partial nephrectomy.  1) Ambulate, Incentive spirometry 2) Advance diet as tolerated 3) Transition to oral pain medication 4) D/C Foley 5) Drain Cr today   Hollice Espy, MD   LOS: 1 day   Hollice Espy 10/18/2014, 9:49 AM

## 2014-10-18 NOTE — Plan of Care (Signed)
Problem: Phase I Progression Outcomes Goal: Voiding-avoid urinary catheter unless indicated Outcome: Not Progressing Patient has foley catheter

## 2014-10-19 ENCOUNTER — Telehealth: Payer: Self-pay

## 2014-10-19 LAB — SURGICAL PATHOLOGY

## 2014-10-19 MED ORDER — HYDROMORPHONE HCL 2 MG PO TABS: 2.0000 mg | ORAL_TABLET | ORAL | Status: DC | PRN

## 2014-10-19 MED ORDER — DOCUSATE SODIUM 100 MG PO CAPS: 100.0000 mg | ORAL_CAPSULE | Freq: Two times a day (BID) | ORAL | Status: DC

## 2014-10-19 NOTE — Care Management (Signed)
Important Message  Patient Details  Name: Juan Jackson MRN: [id number] Date of Birth: [birth year]   Medicare Important Message Given:  Yes-second notification given    Juliann Pulse A Allmond 10/19/2014, 12:11 PM

## 2014-10-19 NOTE — Progress Notes (Signed)
Pt d/c home; d/c instructions reviewed w/ pt; pt understanding was verbalized; IV removed catheter in tact, gauze dressing applied; all pt questions answered; pt left unit via wheelchair accompanied by staff 

## 2014-10-19 NOTE — Discharge Summary (Signed)
Date of admission: 10/17/2014  Date of discharge: 10/19/2014  Admission diagnosis: Left renal mass  Discharge diagnosis: Left renal mass  Secondary diagnoses:  Patient Active Problem List   Diagnosis Date Noted  . Left renal mass 10/17/2014  . Right renal mass 08/29/2014    History and Physical: For full details, please see admission history and physical. Briefly, Juan Jackson is a 49 y.o. year old patient with 2.2 cm left renal mass.  He is admitted following  Uncomplicated left robotic partial nephrectomy.    Hospital Course: Patient tolerated the procedure well.  He was then transferred to the floor after an uneventful PACU stay.  His hospital course was uncomplicated.  His diet was advance.  Foley was removed on POD#1.  JP creatinine was consistent with serum and ultimately removed on POD #2 with minimal output.  On POD#2 he had met discharge criteria: was eating a regular diet,  was up and ambulating independently,  pain was well controlled, was voiding without a catheter, and was ready to for discharge.     Laboratory values:   Recent Labs  10/18/14 0412  WBC 9.9  HGB 13.1  HCT 40.7    Recent Labs  10/18/14 0412  NA 134*  K 4.4  CL 101  CO2 25  GLUCOSE 126*  BUN 21*  CREATININE 1.08  CALCIUM 8.8*   No results for input(s): LABPT, INR in the last 72 hours. No results for input(s): LABURIN in the last 72 hours. Results for orders placed or performed during the hospital encounter of 10/03/14  Urine culture     Status: None   Collection Time: 10/03/14  9:21 AM  Result Value Ref Range Status   Specimen Description URINE, CLEAN CATCH  Final   Special Requests none  Final   Culture NO GROWTH 2 DAYS  Final   Report Status 10/05/2014 FINAL  Final    Disposition: Home  Discharge instruction: The patient was instructed to be ambulatory but told to refrain from heavy lifting, strenuous activity, or driving.   Discharge medications:   Medication List    TAKE  these medications        allopurinol 300 MG tablet  Commonly known as:  ZYLOPRIM  Take 300 mg by mouth every evening.     docusate sodium 100 MG capsule  Commonly known as:  COLACE  Take 1 capsule (100 mg total) by mouth 2 (two) times daily.     HYDROmorphone 2 MG tablet  Commonly known as:  DILAUDID  Take 1-2 tablets (2-4 mg total) by mouth every 4 (four) hours as needed for severe pain.     lisinopril-hydrochlorothiazide 10-12.5 MG per tablet  Commonly known as:  PRINZIDE,ZESTORETIC  Take 1 tablet by mouth every morning.     meloxicam 15 MG tablet  Commonly known as:  MOBIC  Take 15 mg by mouth every morning.     pantoprazole 40 MG tablet  Commonly known as:  PROTONIX  40 mg every evening.     pravastatin 10 MG tablet  Commonly known as:  PRAVACHOL  1 tablet every evening.     tiZANidine 4 MG tablet  Commonly known as:  ZANAFLEX  Take 4 mg by mouth 2 (two) times daily as needed for muscle spasms.        Followup:      Follow-up Information    Follow up with Hollice Espy, MD In 4 weeks.   Specialty:  Urology   Why:  for  labs/ wound check   Contact information:   [place] [address] [phone]

## 2014-10-19 NOTE — Plan of Care (Addendum)
Problem: Phase II Progression Outcomes Goal: Pain controlled Progress activity as tolerated unless otherwise ordered Patient is stable with son at bedside overnight. Patient has hypoactive bowel sound and distended abdomen. One time dose of suppository was administered. Patient is hemodynamically stable with VS WDL for patient. Patient surgical dressing is dry and intact with old drainage. Patient continue to have increase activity tolerance with PRN pain management. Will continue to monitor  Suppository was administered d/t to constipation. Patient was able to have 1 small BM.

## 2014-10-19 NOTE — Discharge Instructions (Signed)
·   Activity:  You are encouraged to ambulate frequently (about every hour during waking hours) to help prevent blood clots from forming in your legs or lungs.  However, you should not engage in any heavy lifting (> 5-10 lbs), strenuous activity, or straining. ° °· Diet: You should advance your diet as instructed by your physician.  It will be normal to have some bloating, nausea, and abdominal discomfort intermittently. ° °· Prescriptions:  You will be provided a prescription for pain medication to take as needed.  If your pain is not severe enough to require the prescription pain medication, you may take extra strength Tylenol instead which will have less side effects.  You should also take a prescribed stool softener to avoid straining with bowel movements as the prescription pain medication may constipate you. ° °· Incisions: You may remove your dressing bandages 48 hours after surgery if not removed in the hospital.  You will either have some small staples or special tissue glue at each of the incision sites. Once the bandages are removed (if present), the incisions may stay open to air.  You may start showering (but not soaking or bathing in water) the 2nd day after surgery and the incisions simply need to be patted dry after the shower.  No additional care is needed. ° °What to call us about: You should call the office if you develop fever > 101 or develop persistent vomiting, redness or draining around your incision, or any other concerning symptoms.   ° °[facility] °[address], [address] °[place], [place] [address] °[phone] ° ° °

## 2014-10-19 NOTE — Progress Notes (Signed)
2 Days Post-Op Subjective: Regular diet.  Several small BMs.  Ambulating.  Voiding, doing well.    Objective: Vital signs in last 24 hours: Temp:  [97.4 F (36.3 C)-99.5 F (37.5 C)] 98.4 F (36.9 C) (06/29 0748) Pulse Rate:  [92-106] 101 (06/29 0748) Resp:  [16-20] 20 (06/29 0748) BP: (124-136)/(69-75) 128/69 mmHg (06/29 0748) SpO2:  [98 %-100 %] 99 % (06/29 0748)  Intake/Output from previous day: 06/28 0701 - 06/29 0700 In: 3891.7 [P.O.:600; I.V.:3291.7] Out: 1435 [Urine:1325; Drains:40] Intake/Output this shift: Total I/O In: 125 [I.V.:125] Out: 8206 [Urine:1350; Drains:25]  Physical Exam:  General: Alert and oriented. CV: RRR Lungs: Clear bilaterally. GI: Soft, Nondistended. Incisions: Clean and dry.  Extremities: Nontender, no erythema, no edema.  Lab Results:  Recent Labs  10/18/14 0412  HGB 13.1  HCT 40.7           Recent Labs  10/18/14 0412  CREATININE 1.08           No results found for this or any previous visit (from the past 24 hour(s)).  Assessment/Plan: POD# 2 s/p robotic partial nephrectomy.  1) Ambulate, Incentive spirometry 2) d/c JP drain 3) pathology reviewed with patient, 2.2 cm RCC, margins negative. 4) d/c home later today   Hollice Espy, MD   LOS: 2 days   Hollice Espy 10/19/2014, 12:18 PM

## 2014-10-19 NOTE — Telephone Encounter (Signed)
Dr. Luana Shu from the hospital called with left renal mass results: renal cell carcinoma with negative margins.

## 2014-10-25 ENCOUNTER — Inpatient Hospital Stay: Payer: Commercial Managed Care - HMO | Attending: Oncology | Admitting: Oncology

## 2014-10-25 ENCOUNTER — Other Ambulatory Visit: Payer: Self-pay | Admitting: Licensed Clinical Social Worker

## 2014-10-25 DIAGNOSIS — K769 Liver disease, unspecified: Secondary | ICD-10-CM | POA: Insufficient documentation

## 2014-10-25 DIAGNOSIS — K219 Gastro-esophageal reflux disease without esophagitis: Secondary | ICD-10-CM | POA: Insufficient documentation

## 2014-10-25 DIAGNOSIS — Z79899 Other long term (current) drug therapy: Secondary | ICD-10-CM | POA: Insufficient documentation

## 2014-10-25 DIAGNOSIS — C642 Malignant neoplasm of left kidney, except renal pelvis: Secondary | ICD-10-CM | POA: Insufficient documentation

## 2014-10-25 DIAGNOSIS — E785 Hyperlipidemia, unspecified: Secondary | ICD-10-CM | POA: Insufficient documentation

## 2014-10-25 DIAGNOSIS — Z905 Acquired absence of kidney: Secondary | ICD-10-CM | POA: Insufficient documentation

## 2014-10-25 DIAGNOSIS — I10 Essential (primary) hypertension: Secondary | ICD-10-CM | POA: Insufficient documentation

## 2014-10-25 NOTE — Patient Outreach (Signed)
Assessment: CSW called home phone number of client on 10/25/14.  CSW verified identity of client. CSW was given verbal permission by client  to speak with client about current medical needs for client.  CSW spoke with client on 10/25/14 about current needs of client and about recent surgery of client.  Client said he had been hospitalized recently for kidney surgery. Client said a kidney mass was removed for client and also client said that a part of one of his kidneys was also removed.  Client said he had returned home after surgery and was doing fairly well. Client said he had adequate food supply, had all his prescribed medications, and said that his son helped him in the home with daily care needs that may arise for client.  Client said that his son would transport client to and from scheduled medical appointments. Client said he is taking pain medication as prescribed. CSW informed client that Freda Jackson had previously mailed [facility] consent form to client but that [facility] office had not received completed [facility] consent form.  CSW informed client on 10/25/14 that Freda Jackson would again mail client [facility] consent form on 10/25/14 in order for client to read and complete needed [facility] consent form. Client agreed to receive and review [facility] consent form.  Client said he is scheduled for return visit/appointment with his surgeon in about three weeks.  He said he was recovering fairly well at home.  CSW invited client to call CSW at [phone] as needed to discuss social work needs of client.  CSW thanked client for phone conversation with CSW on 10/25/14.  Plan: Client to receive, read and complete [facility] consent form for client and return completed [facility] consent form to [facility] office in [place]. Client to take medications as prescribed and attend scheduled medical appointments.. Client to communicate with primary care doctor for client, as needed, to discuss medical needs of client. CSW to call client next week to discuss  with client the completion of [facility] consent form for client.  Norva Riffle.Pegeen Stiger MSW, LCSW Licensed Clinical Social Worker [facility] Care Management [phone]

## 2014-11-01 ENCOUNTER — Other Ambulatory Visit: Payer: Self-pay | Admitting: Licensed Clinical Social Worker

## 2014-11-01 NOTE — Patient Outreach (Signed)
Assessment:  CSW called home phone number of client on 11/01/14. CSW verified identity of client.  CSW spoke with client about [facility] consent form completion.  Freda Jackson, [place] Management Assistant, had recently mailed (for second time) [facility] consent form to client for client to review.  Client and CSW spoke of [facility] consent form and aspects of completing [facility] consent form. Client told CSW that he would review [facility] consent form.  CSW invited client to call CSW at [phone] to further discuss [facility] consent form completion for client.  Plan: Client to review and complete [facility] consent form and return completed [facility] consent form to [facility] office in [place]. Client to take medications as prescribed and attend scheduled medical appointments.  CSW to call client in one week to discuss [facility] consent form completion for client.  Norva Riffle.Chaim Gatley MSW, LCSW Licensed Clinical Social Worker [facility] Care Management [phone]

## 2014-11-08 ENCOUNTER — Other Ambulatory Visit: Payer: Self-pay | Admitting: Licensed Clinical Social Worker

## 2014-11-08 NOTE — Patient Outreach (Signed)
Assessment: CSW called client via phone number on 11/08/14.  CSW verified identity of client.  Client said he is scheduled to see oncologist in one week.  He said he sometimes has difficulty paying copay amounts due for his medical appontments.  He said he has his prescribed medications.  He said he drives himself to most of his appointments.  He said he sometimes has difficulty obtaining needed food items. CSW talked with Jaqwon about option of his going to local food pantry for some food support assistance.  He said his son helps him to care for home cleaning and maintenance.Marland Kitchen  He said he did not feel he had any nursing needs at present.  CSW spoke with client about social work, nursing and pharmacy support services of [facility] program.  He said he recently mailed [facility] consent form (completed) back to [facility] office in [place], [place]. He said he has a pharmacy he uses in [place], [place]. His pharmacy is [facility] in [place]. CSW encouraged Tahjir to call CSW at [phone] as needed to discuss social work needs of client.  Client said he was starting to recover from his recent surgery and had been able to make short trips regularly into the community.  Plan: Client to take medications as prescribed and to attend scheduled medical appointments. CSW to call client in three weeks to assess needs of client at that time.  Norva Riffle.Pranathi Winfree MSW, LCSW Licensed Clinical Social Worker [facility] Care Management [phone]

## 2014-11-14 ENCOUNTER — Telehealth: Payer: Self-pay | Admitting: *Deleted

## 2014-11-14 NOTE — Telephone Encounter (Signed)
Pt states that he is scheduled to see Dr. Grayland Ormond tomorrow at 10:30; however, he does not have his co-pay. What can/should he do? Can he still be seen; or will he need to reschedule?

## 2014-11-15 ENCOUNTER — Inpatient Hospital Stay (HOSPITAL_BASED_OUTPATIENT_CLINIC_OR_DEPARTMENT_OTHER): Payer: Commercial Managed Care - HMO | Admitting: Oncology

## 2014-11-15 VITALS — BP 126/83 | HR 84 | Temp 97.7°F | Resp 20 | Wt 280.2 lb

## 2014-11-15 DIAGNOSIS — E785 Hyperlipidemia, unspecified: Secondary | ICD-10-CM | POA: Diagnosis not present

## 2014-11-15 DIAGNOSIS — C642 Malignant neoplasm of left kidney, except renal pelvis: Secondary | ICD-10-CM

## 2014-11-15 DIAGNOSIS — K769 Liver disease, unspecified: Secondary | ICD-10-CM | POA: Diagnosis not present

## 2014-11-15 DIAGNOSIS — K219 Gastro-esophageal reflux disease without esophagitis: Secondary | ICD-10-CM

## 2014-11-15 DIAGNOSIS — Z79899 Other long term (current) drug therapy: Secondary | ICD-10-CM | POA: Diagnosis not present

## 2014-11-15 DIAGNOSIS — Z905 Acquired absence of kidney: Secondary | ICD-10-CM | POA: Diagnosis not present

## 2014-11-15 DIAGNOSIS — I10 Essential (primary) hypertension: Secondary | ICD-10-CM

## 2014-11-18 ENCOUNTER — Encounter: Payer: Self-pay | Admitting: Urology

## 2014-11-18 ENCOUNTER — Ambulatory Visit (INDEPENDENT_AMBULATORY_CARE_PROVIDER_SITE_OTHER): Payer: Commercial Managed Care - HMO | Admitting: Urology

## 2014-11-18 VITALS — BP 133/90 | HR 80 | Ht 74.0 in | Wt 278.0 lb

## 2014-11-18 DIAGNOSIS — Q6100 Congenital renal cyst, unspecified: Secondary | ICD-10-CM

## 2014-11-18 DIAGNOSIS — N281 Cyst of kidney, acquired: Secondary | ICD-10-CM

## 2014-11-18 DIAGNOSIS — C642 Malignant neoplasm of left kidney, except renal pelvis: Secondary | ICD-10-CM

## 2014-11-18 LAB — URINALYSIS, COMPLETE
Bilirubin, UA: NEGATIVE
Glucose, UA: NEGATIVE
KETONES UA: NEGATIVE
Leukocytes, UA: NEGATIVE
NITRITE UA: NEGATIVE
Protein, UA: NEGATIVE
RBC, UA: NEGATIVE
Urobilinogen, Ur: 0.2 mg/dL (ref 0.2–1.0)
pH, UA: 6 (ref 5.0–7.5)

## 2014-11-18 LAB — MICROSCOPIC EXAMINATION
Bacteria, UA: NONE SEEN
RBC, UA: NONE SEEN /hpf (ref 0–?)
RENAL EPITHEL UA: NONE SEEN /HPF

## 2014-11-18 NOTE — Progress Notes (Signed)
11/18/2014 12:34 PM   Juan Jackson [birth year] [id number]  Referring provider: Glendon Axe, MD [place], [place] [address]  Chief Complaint  Patient presents with  . Routine Post Op    HPI: 49 year old male with a incidental 17 millimeter LEFT lower pole enhancing renal mass s/p left robotic partial nephrectomy on 10/07/14 who presents today for routine follow up.     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 His post op course was uncomplicated.   He initially underwent a MRI of the lumbar spine approximately 6 months ago for chronic back pain and was found to have an incidental RIGHT renal mass measuring 9 mm which was considered indeterminate wtih follow-up imaging in 6 months recommended. Subsequent MRI obtained on 08/17/2014 of the abdomen with and without contrast showed that this lesion on the RIGHT side now measuring 10 mm represented a nonenhancing calcified renal lesion not particularly suspicious. He did however have a contralateral lesion as mentioned above concerning for malignancy.  He denies any history of renal cell carcinoma in his family. No history of flank pain or gross hematuria. No history of kidney stones.   He denies any postoperative issues. He is overall been doing quite well. No urinary issues.    PMH: Past Medical History  Diagnosis Date  . Hypertension   . Gout   . Liver disease, chronic   . ETOH abuse   . Lumbar back pain   . Peripheral neuropathy   . GERD (gastroesophageal reflux disease)   . Hyperlipidemia   . PONV (postoperative nausea and vomiting)     Surgical History: Past Surgical History  Procedure Laterality Date  . Tonsillectomy    . Vasectomy Bilateral 1996  . Robotic assited partial nephrectomy Left 10/17/2014    Procedure: ROBOTIC ASSITED PARTIAL NEPHRECTOMY;  Surgeon: Hollice Espy, MD;  Location: [facility] ORS;  Service:  Urology;  Laterality: Left;    Home Medications:    Medication List       This list is accurate as of: 11/18/14 12:34 PM.  Always use your most recent med list.               allopurinol 300 MG tablet  Commonly known as:  ZYLOPRIM  Take 300 mg by mouth every evening.     lisinopril-hydrochlorothiazide 10-12.5 MG per tablet  Commonly known as:  PRINZIDE,ZESTORETIC  Take 1 tablet by mouth every morning.     meloxicam 15 MG tablet  Commonly known as:  MOBIC  Take 15 mg by mouth every morning.     pantoprazole 40 MG tablet  Commonly known as:  PROTONIX  40 mg every evening.     pravastatin 10 MG tablet  Commonly known as:  PRAVACHOL  1 tablet every evening.     tiZANidine 4 MG tablet  Commonly known as:  ZANAFLEX  Take 4 mg by mouth 2 (two) times daily as needed for muscle spasms.     traMADol-acetaminophen 37.5-325 MG per tablet  Commonly known as:  ULTRACET        Allergies:  Allergies  Allergen Reactions  . Morphine And Related Nausea And Vomiting    Family History: Family History  Problem Relation Age of Onset  . Cancer Mother   . Diabetes Mother   . Coronary artery disease Mother   . Coronary artery disease Father   . Hypertension Father   . Diabetes Father   .  Diabetes Sister   . Diabetes Brother   . Cirrhosis Brother   . Diabetes Sister   . Thyroid disease Sister     Social History:  reports that he quit smoking about 22 years ago. He has never used smokeless tobacco. He reports that he does not drink alcohol or use illicit drugs.   Physical Exam: BP 133/90 mmHg  Pulse 80  Ht 6\' 2"  (1.88 m)  Wt 278 lb (126.1 kg)  BMI 35.68 kg/m2  Constitutional:  Alert and oriented, No acute distress. HEENT: [place] AT, moist mucus membranes.  Trachea midline, no masses. Cardiovascular: No clubbing, cyanosis, or edema. Respiratory: Normal respiratory effort, no increased work of breathing. GI: Abdomen is soft, nontender, nondistended, no abdominal masses.    Incisions now well-healed, no surrounding erythema or drainage. GU: No CVA tenderness.  Skin: No rashes, bruises or suspicious lesions.. Neurologic: Grossly intact, no focal deficits, moving all 4 extremities. Psychiatric: Normal mood and affect.  Laboratory Data: Results for orders placed or performed in visit on 11/18/14  Microscopic Examination  Result Value Ref Range   WBC, UA 0-5 0 -  5 /hpf   RBC, UA None seen 0 -  2 /hpf   Epithelial Cells (non renal) 0-10 0 - 10 /hpf   Renal Epithel, UA None seen None seen /hpf   Bacteria, UA None seen None seen/Few  Urinalysis, Complete  Result Value Ref Range   Specific Gravity, UA <1.005 (L) 1.005 - 1.030   pH, UA 6.0 5.0 - 7.5   Color, UA Yellow Yellow   Appearance Ur Clear Clear   Leukocytes, UA Negative Negative   Protein, UA Negative Negative/Trace   Glucose, UA Negative Negative   Ketones, UA Negative Negative   RBC, UA Negative Negative   Bilirubin, UA Negative Negative   Urobilinogen, Ur 0.2 0.2 - 1.0 mg/dL   Nitrite, UA Negative Negative   Microscopic Examination See below:   Basic metabolic panel  Result Value Ref Range   Glucose 89 65 - 99 mg/dL   BUN 15 6 - 24 mg/dL   Creatinine, Ser 1.09 0.76 - 1.27 mg/dL   GFR calc non Af Amer 79 >59 mL/min/1.73   GFR calc Af Amer 91 >59 mL/min/1.73   BUN/Creatinine Ratio 14 9 - 20   Sodium 139 134 - 144 mmol/L   Potassium 4.5 3.5 - 5.2 mmol/L   Chloride 100 97 - 108 mmol/L   CO2 23 18 - 29 mmol/L   Calcium 9.9 8.7 - 10.2 mg/dL    Assessment & Plan:   49 year old male with incidental 2.2 left renal cell carcinoma status post uncomplicated left robotic partial nephrectomy.  1. 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  No postoperative issues. Labs stable. Him and follow-up imaging in 6 months. - Urinalysis, Complete - Basic metabolic panel - CT Abdomen Pelvis W Wo Contrast;  Future  2. Renal cyst, right Plan to follow up with surveillance imaging as above.   Return in about 6 months (around 05/21/2015) for f/u CT abd/ pelvis 6 months.  Hollice Espy, MD  [facility] [address] [place], [place] [address] [phone]

## 2014-11-19 LAB — BASIC METABOLIC PANEL
BUN/Creatinine Ratio: 14 (ref 9–20)
BUN: 15 mg/dL (ref 6–24)
CHLORIDE: 100 mmol/L (ref 97–108)
CO2: 23 mmol/L (ref 18–29)
CREATININE: 1.09 mg/dL (ref 0.76–1.27)
Calcium: 9.9 mg/dL (ref 8.7–10.2)
GFR calc non Af Amer: 79 mL/min/{1.73_m2} (ref >59)
GFR, EST AFRICAN AMERICAN: 91 mL/min/{1.73_m2} (ref >59)
GLUCOSE: 89 mg/dL (ref 65–99)
Potassium: 4.5 mmol/L (ref 3.5–5.2)
Sodium: 139 mmol/L (ref 134–144)

## 2014-11-22 ENCOUNTER — Encounter: Payer: Self-pay | Admitting: Oncology

## 2014-11-22 DIAGNOSIS — C642 Malignant neoplasm of left kidney, except renal pelvis: Secondary | ICD-10-CM

## 2014-11-22 HISTORY — DX: Malignant neoplasm of left kidney, except renal pelvis: C64.2

## 2014-11-22 NOTE — Progress Notes (Signed)
[facility]  Telephone:(336) [phone] Fax:(336) [phone]  ID: Juan Jackson OB: [birth year]  MR#: [id number]  KYH#:[id number]  Patient Care Team: Glendon Axe, MD as PCP - General (Internal Medicine) Sharlet Salina, MD as Referring Physician (Physical Medicine and Rehabilitation) Katha Cabal, LCSW as [place] Management (Licensed Clinical Social Worker)  CHIEF COMPLAINT:  Chief Complaint  Patient presents with  . Follow-up    renal mass    INTERVAL HISTORY: Patient  Returns to clinic today for routine follow-up of her is partial left nephrectomy. He currently feels well and is fully recovered from his surgery. He has no neurologic complaints. He denies any fevers. He has a good appetite and denies weight loss. He denies any chest pain or shortness of breath. He denies any nausea, vomiting, constipation, or diarrhea. He has no urinary complaints. Patient offers no specific complaints today.  REVIEW OF SYSTEMS:   Review of Systems  Constitutional: Negative.   Gastrointestinal: Negative.  Negative for abdominal pain.  Musculoskeletal: Negative.     As per HPI. Otherwise, a complete review of systems is negatve.  PAST MEDICAL HISTORY: Past Medical History  Diagnosis Date  . Hypertension   . Gout   . Liver disease, chronic   . ETOH abuse   . Lumbar back pain   . Peripheral neuropathy   . GERD (gastroesophageal reflux disease)   . Hyperlipidemia   . PONV (postoperative nausea and vomiting)     PAST SURGICAL HISTORY: Past Surgical History  Procedure Laterality Date  . Tonsillectomy    . Vasectomy Bilateral 1996  . Robotic assited partial nephrectomy Left 10/17/2014    Procedure: ROBOTIC ASSITED PARTIAL NEPHRECTOMY;  Surgeon: Hollice Espy, MD;  Location: [facility] ORS;  Service: Urology;  Laterality: Left;    FAMILY HISTORY Family History  Problem Relation Age of Onset  . Cancer Mother   . Diabetes Mother   . Coronary  artery disease Mother   . Coronary artery disease Father   . Hypertension Father   . Diabetes Father   . Diabetes Sister   . Diabetes Brother   . Cirrhosis Brother   . Diabetes Sister   . Thyroid disease Sister        ADVANCED DIRECTIVES:    HEALTH MAINTENANCE: History  Substance Use Topics  . Smoking status: Former Smoker    Quit date: 12/22/1991  . Smokeless tobacco: Never Used  . Alcohol Use: No     Colonoscopy:  PAP:  Bone density:  Lipid panel:  Allergies  Allergen Reactions  . Morphine And Related Nausea And Vomiting    Current Outpatient Prescriptions  Medication Sig Dispense Refill  . allopurinol (ZYLOPRIM) 300 MG tablet Take 300 mg by mouth every evening.     Marland Kitchen lisinopril-hydrochlorothiazide (PRINZIDE,ZESTORETIC) 10-12.5 MG per tablet Take 1 tablet by mouth every morning.     . meloxicam (MOBIC) 15 MG tablet Take 15 mg by mouth every morning.     . pantoprazole (PROTONIX) 40 MG tablet 40 mg every evening.     . pravastatin (PRAVACHOL) 10 MG tablet 1 tablet every evening.     Marland Kitchen tiZANidine (ZANAFLEX) 4 MG tablet Take 4 mg by mouth 2 (two) times daily as needed for muscle spasms.     . traMADol-acetaminophen (ULTRACET) 37.5-325 MG per tablet      No current facility-administered medications for this visit.    OBJECTIVE: Filed Vitals:   11/15/14 1100  BP: 126/83  Pulse: 84  Temp: 97.7  F (36.5 C)  Resp: 20     Body mass index is 35.96 kg/(m^2).    ECOG FS:0 - Asymptomatic  General: Well-developed, well-nourished, no acute distress. Eyes: Pink conjunctiva, anicteric sclera. Lungs: Clear to auscultation bilaterally. Heart: Regular rate and rhythm. No rubs, murmurs, or gallops. Abdomen: Soft, nontender, nondistended. No organomegaly noted, normoactive bowel sounds. Musculoskeletal: No edema, cyanosis, or clubbing. Neuro: Alert, answering all questions appropriately. Cranial nerves grossly intact. Skin: No rashes or petechiae noted. Psych: Normal  affect.   LAB RESULTS:  Lab Results  Component Value Date   NA 139 11/18/2014   K 4.5 11/18/2014   CL 100 11/18/2014   CO2 23 11/18/2014   GLUCOSE 89 11/18/2014   BUN 15 11/18/2014   CREATININE 1.09 11/18/2014   CALCIUM 9.9 11/18/2014   PROT 7.6 08/25/2014   ALBUMIN 4.3 08/25/2014   AST 22 08/25/2014   ALT 32 08/25/2014   ALKPHOS 53 08/25/2014   BILITOT 0.3 08/25/2014   GFRNONAA 79 11/18/2014   GFRAA 91 11/18/2014    Lab Results  Component Value Date   WBC 9.9 10/18/2014   NEUTROABS 3.2 08/25/2014   HGB 13.1 10/18/2014   HCT 40.7 10/18/2014   MCV 87.6 10/18/2014   PLT 114* 10/18/2014     STUDIES: No results found.  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  PLAN:    1.  Kidney cancer: Patient had a resection with partial robotic nephrectomy. No further intervention is needed at this time. Return to clinic in 6 months with repeat imaging and further evaluation.   Patient will require evaluation with laboratory work every 6 months for 2 years and then annually after 5 years. We will get baseline post surgical CT in 6 months and then if negative we will do annually for 3 years.  Patient expressed understanding and was in agreement with this plan. He also understands that He can call clinic at any time with any questions, concerns, or complaints.   No matching staging information was found for the patient.  Lloyd Huger, MD   11/22/2014 4:15 PM

## 2014-12-21 ENCOUNTER — Encounter: Payer: Self-pay | Admitting: Licensed Clinical Social Worker

## 2014-12-21 NOTE — Patient Outreach (Signed)
  [facility]) Care Management  [facility] Social Work  12/21/2014  Juan Jackson [birth year] [id number]  Subjective:    Objective:   Current Medications:  Current Outpatient Prescriptions  Medication Sig Dispense Refill  . allopurinol (ZYLOPRIM) 300 MG tablet Take 300 mg by mouth every evening.     Marland Kitchen lisinopril-hydrochlorothiazide (PRINZIDE,ZESTORETIC) 10-12.5 MG per tablet Take 1 tablet by mouth every morning.     . meloxicam (MOBIC) 15 MG tablet Take 15 mg by mouth every morning.     . pantoprazole (PROTONIX) 40 MG tablet 40 mg every evening.     . pravastatin (PRAVACHOL) 10 MG tablet 1 tablet every evening.     Marland Kitchen tiZANidine (ZANAFLEX) 4 MG tablet Take 4 mg by mouth 2 (two) times daily as needed for muscle spasms.     . traMADol-acetaminophen (ULTRACET) 37.5-325 MG per tablet      No current facility-administered medications for this visit.    Functional Status:  In your present state of health, do you have any difficulty performing the following activities: 10/17/2014 10/17/2014  Hearing? - N  Vision? - N  Difficulty concentrating or making decisions? - N  Walking or climbing stairs? - N  Dressing or bathing? - N  Doing errands, shopping? N -  Preparing Food and eating ? - -  Using the Toilet? - -  In the past six months, have you accidently leaked urine? - -  Do you have problems with loss of bowel control? - -  Managing your Medications? - -  Managing your Finances? - -  Housekeeping or managing your Housekeeping? - -    Fall/Depression Screening:  PHQ 2/9 Scores 08/26/2014  PHQ - 2 Score 2  PHQ- 9 Score 5    Assessment:   CSW had communicated for several months with client regarding needs of client.  CSW had talked with client several times regarding [facility] consent form completion for client. [facility] consent form was mailed to client on two occasions in recent months.  While client did give verbal consent to discuss his current needs, medical status, client  did not mail [facility] completed consent to [facility] office in [place]. Client informed CSW that [facility] consent had been mailed by client to [facility] office. However, [facility] office has not received [facility] completed consent on client. A completed [facility] consent form for client is not in the [place] documents for client in the [employer] system.   Thus, CSW is discharging client, closing case on client  for [place] services on 12/21/14 due to client not completing [facility] consent form needed.  Plan: CSW is discharging client from [place] on 12/21/14 due to fact that client would not complete [facility] consent form needed. CSW to inform [name] on 12/21/14 that Victor discharged client from [place] services on 12/21/14. CSW to send physician case closure letter to primary doctor of client on 12/21/14.  [name] MSW, LCSW Licensed Clinical Social Worker [facility] Care Management [phone]    Plan:

## 2014-12-27 NOTE — Patient Outreach (Signed)
[facility]) Care Management  12/27/2014  HARMON BOMMARITO Dec 11, 1965 [id number]   Notification from Theadore Nan, LCSW to close case due to patient refused [place] [facility] services.  Thanks, Ronnell Freshwater. [place], [place] Assistant Phone: [phone] Fax: [phone]

## 2015-05-18 ENCOUNTER — Ambulatory Visit
Admission: RE | Admit: 2015-05-18 | Discharge: 2015-05-18 | Disposition: A | Discharge status: Home / Self Care | Payer: Medicare HMO | Source: Ambulatory Visit | Attending: Oncology | Admitting: Oncology

## 2015-05-18 DIAGNOSIS — K8020 Calculus of gallbladder without cholecystitis without obstruction: Secondary | ICD-10-CM | POA: Diagnosis not present

## 2015-05-18 DIAGNOSIS — N289 Disorder of kidney and ureter, unspecified: Secondary | ICD-10-CM | POA: Diagnosis not present

## 2015-05-18 DIAGNOSIS — Z905 Acquired absence of kidney: Secondary | ICD-10-CM | POA: Insufficient documentation

## 2015-05-18 DIAGNOSIS — I7090 Unspecified atherosclerosis: Secondary | ICD-10-CM | POA: Diagnosis not present

## 2015-05-18 DIAGNOSIS — C642 Malignant neoplasm of left kidney, except renal pelvis: Secondary | ICD-10-CM | POA: Diagnosis present

## 2015-05-18 DIAGNOSIS — K760 Fatty (change of) liver, not elsewhere classified: Secondary | ICD-10-CM | POA: Diagnosis not present

## 2015-05-18 MED ORDER — IOHEXOL 350 MG/ML SOLN
100.0000 mL | Freq: Once | INTRAVENOUS | Status: AC | PRN
  Administered 2015-05-18: 100 mL via INTRAVENOUS

## 2015-05-22 ENCOUNTER — Inpatient Hospital Stay: Payer: Commercial Managed Care - HMO | Admitting: Oncology

## 2015-05-22 ENCOUNTER — Inpatient Hospital Stay: Payer: Commercial Managed Care - HMO

## 2015-05-24 ENCOUNTER — Encounter: Payer: Self-pay | Admitting: Urology

## 2015-05-24 ENCOUNTER — Ambulatory Visit (INDEPENDENT_AMBULATORY_CARE_PROVIDER_SITE_OTHER): Payer: Commercial Managed Care - HMO | Admitting: Urology

## 2015-05-24 VITALS — BP 115/81 | HR 89 | Ht 74.0 in | Wt 277.6 lb

## 2015-05-24 DIAGNOSIS — Q6100 Congenital renal cyst, unspecified: Secondary | ICD-10-CM

## 2015-05-24 DIAGNOSIS — C642 Malignant neoplasm of left kidney, except renal pelvis: Secondary | ICD-10-CM

## 2015-05-24 DIAGNOSIS — N281 Cyst of kidney, acquired: Secondary | ICD-10-CM

## 2015-05-24 NOTE — Progress Notes (Signed)
6:07 PM  05/24/2015  Juan Jackson 02/28/1966 [id number]  Referring provider: Glendon Axe, MD [facility] [place], [place] [address]  Chief Complaint  Patient presents with  . Follow-up    diagnostic study    HPI: 50 year old male with a incidental 17 millimeter LEFT lower pole enhancing renal mass s/p left robotic partial nephrectomy on 10/07/14.  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 His post op course was uncomplicated.   He initially underwent a MRI of the lumbar spine for chronic back pain and was found to have an incidental RIGHT renal mass measuring 9 mm which was considered indeterminate wtih follow-up imaging in 6 months recommended. Subsequent MRI obtained on 08/17/2014 of the abdomen with and without contrast showed that this lesion on the RIGHT side now measuring 10 mm represented a nonenhancing calcified renal lesion not particularly suspicious. He did however have a contralateral lesion as mentioned above concerning for malignancy now s/p partial nephrectomy.    He returns today for his 6 month routine surveillance visit. CT abdomen with and without contrast was reviewed today in detail. This reveals no evidence of recurrent tumor and only postsurgical changes on the left.  The right-sided lesion is approximately stable and will continue to be surveyed on serial imaging.    Overall, he reports over the last month he's been feeling unwell. He's had some shoulder and arm pain as well as some nausea. He has seen his PCP but is thinking about going back. No flank pain or hematuria.   PMH: Past Medical History  Diagnosis Date  . Hypertension   . Gout   . Liver disease, chronic   . ETOH abuse   . Lumbar back pain   . Peripheral neuropathy ([place])   . GERD (gastroesophageal reflux disease)   . Hyperlipidemia   . PONV (postoperative nausea and vomiting)   .  Cancer of left kidney ([place]) 11/22/2014  . BP (high blood pressure) 11/17/2013  . Acid reflux 11/17/2013  . Gastro-esophageal reflux disease without esophagitis 01/12/2014  . Neuritis or radiculitis due to rupture of lumbar intervertebral disc 02/03/2014    Surgical History: Past Surgical History  Procedure Laterality Date  . Tonsillectomy    . Vasectomy Bilateral 1996  . Robotic assited partial nephrectomy Left 10/17/2014    Procedure: ROBOTIC ASSITED PARTIAL NEPHRECTOMY;  Surgeon: Hollice Espy, MD;  Location: [facility] ORS;  Service: Urology;  Laterality: Left;    Home Medications:    Medication List       This list is accurate as of: 05/24/15  6:07 PM.  Always use your most recent med list.               allopurinol 300 MG tablet  Commonly known as:  ZYLOPRIM  Take 300 mg by mouth every evening. Reported on 05/24/2015     cholecalciferol 1000 units tablet  Commonly known as:  VITAMIN D  Take 1,000 Units by mouth daily.     lisinopril-hydrochlorothiazide 10-12.5 MG tablet  Commonly known as:  PRINZIDE,ZESTORETIC  Take 1 tablet by mouth every morning.     meloxicam 15 MG tablet  Commonly known as:  MOBIC  Take 15 mg by mouth every morning.     pantoprazole 40 MG tablet  Commonly known as:  PROTONIX  40 mg every evening.     pravastatin 10 MG tablet  Commonly known as:  PRAVACHOL  1 tablet every evening.     tiZANidine 4 MG tablet  Commonly known as:  ZANAFLEX  Take 4 mg by mouth 2 (two) times daily as needed for muscle spasms.     traMADol 50 MG tablet  Commonly known as:  ULTRAM  1-2 tablets every 6-8 hours as needed.     traMADol-acetaminophen 37.5-325 MG tablet  Commonly known as:  ULTRACET  Reported on 05/24/2015        Allergies:  Allergies  Allergen Reactions  . Morphine And Related Nausea And Vomiting    Family History: Family History  Problem Relation Age of Onset  . Cancer Mother   . Diabetes Mother   . Coronary artery disease Mother   .  Coronary artery disease Father   . Hypertension Father   . Diabetes Father   . Diabetes Sister   . Diabetes Brother   . Cirrhosis Brother   . Diabetes Sister   . Thyroid disease Sister     Social History:  reports that he quit smoking about 23 years ago. He has never used smokeless tobacco. He reports that he does not drink alcohol or use illicit drugs.   Physical Exam: BP 115/81 mmHg  Pulse 89  Ht 6\' 2"  (1.88 m)  Wt 277 lb 9.6 oz (125.919 kg)  BMI 35.63 kg/m2  Constitutional:  Alert and oriented, No acute distress. HEENT: [place] AT, moist mucus membranes.  Trachea midline, no masses. Cardiovascular: No clubbing, cyanosis, or edema. Respiratory: Normal respiratory effort, no increased work of breathing. GI: Abdomen is soft, nontender, nondistended, no abdominal masses.   Incisions now well-healed scars.   GU: No CVA tenderness.  Skin: No rashes, bruises or suspicious lesions.. Neurologic: Grossly intact, no focal deficits, moving all 4 extremities. Psychiatric: Normal mood and affect.  Laboratory Data: Results for orders placed or performed in visit on 11/18/14  Microscopic Examination  Result Value Ref Range   WBC, UA 0-5 0 -  5 /hpf   RBC, UA None seen 0 -  2 /hpf   Epithelial Cells (non renal) 0-10 0 - 10 /hpf   Renal Epithel, UA None seen None seen /hpf   Bacteria, UA None seen None seen/Few  Urinalysis, Complete  Result Value Ref Range   Specific Gravity, UA <1.005 (L) 1.005 - 1.030   pH, UA 6.0 5.0 - 7.5   Color, UA Yellow Yellow   Appearance Ur Clear Clear   Leukocytes, UA Negative Negative   Protein, UA Negative Negative/Trace   Glucose, UA Negative Negative   Ketones, UA Negative Negative   RBC, UA Negative Negative   Bilirubin, UA Negative Negative   Urobilinogen, Ur 0.2 0.2 - 1.0 mg/dL   Nitrite, UA Negative Negative   Microscopic Examination See below:   Basic metabolic panel  Result Value Ref Range   Glucose 89 65 - 99 mg/dL   BUN 15 6 - 24 mg/dL    Creatinine, Ser 1.09 0.76 - 1.27 mg/dL   GFR calc non Af Amer 79 >59 mL/min/1.73   GFR calc Af Amer 91 >59 mL/min/1.73   BUN/Creatinine Ratio 14 9 - 20   Sodium 139 134 - 144 mmol/L   Potassium 4.5 3.5 - 5.2 mmol/L   Chloride 100 97 - 108 mmol/L   CO2 23 18 - 29 mmol/L   Calcium 9.9 8.7 - 10.2 mg/dL   Imaging:  CLINICAL DATA: Left partial nephrectomy 10/17/2014. Restaging. Asymptomatic. Prior vasectomy.  EXAM: CT ABDOMEN AND PELVIS WITHOUT AND WITH  CONTRAST  TECHNIQUE: Multidetector CT imaging of the abdomen and pelvis was performed following the standard protocol before and following the bolus administration of intravenous contrast.  CONTRAST: 147mL OMNIPAQUE IOHEXOL 350 MG/ML SOLN  COMPARISON: 09/23/2014. MRI of 08/17/2014.  FINDINGS: Lower chest: Clear lung bases. Normal heart size without pericardial or pleural effusion.  Hepatobiliary: Mild hepatic steatosis, without focal liver lesion. Multiple small gallstones without acute cholecystitis or biliary duct dilatation.  Pancreas: Normal, without mass or ductal dilatation.  Spleen: Normal in size, without focal abnormality.  Adrenals/Urinary Tract: Normal adrenal glands. No renal calculi or hydronephrosis. Partial nephrectomy about the lateral interpolar left kidney. No evidence of locally recurrent or residual disease. Focus of precontrast hyper attenuation within the more central and posterior interpolar left kidney measures 6 mm on image 63/series 2 and similar on the prior CT. Too small to characterize.  Subtle focus of post-contrast hypoattenuation within the posterior interpolar right kidney measures 7 mm on image 51/series 7. This corresponds to subtle precontrast hyper attenuation on image 51/series 2 and is also too small to characterize.  No hydronephrosis. Normal urinary bladder.  Stomach/Bowel: Proximal gastric underdistention. Scattered colonic diverticula. Normal terminal ileum  and appendix. Normal small bowel.  Vascular/Lymphatic: Aortic and branch vessel atherosclerosis. Patent left renal vein. stable small retroperitoneal nodes. Previously described porta hepatis node is similar at 1.2 cm on image 37/series 4. Likely reactive and related to steatosis. No pelvic adenopathy.  Reproductive: Normal prostate.  Other: No significant free fluid. Small bilateral fat containing inguinal hernias.  Musculoskeletal: No acute osseous abnormality.  IMPRESSION: 1. partial left nephrectomy, without residual, recurrent, or metastatic disease. 2. Too small to characterize lesions in both kidneys. A focus of hyper attenuation prior to contrast in the interpolar left kidney is likely due to a hemorrhagic cyst. The area of hypoenhancement within the posterior right kidney is similar to minimally enlarged since the prior exam. Recommend attention on follow-up. 3. Mild hepatic steatosis. 4. Cholelithiasis. 5. Mildly age advanced atherosclerosis.   Electronically Signed  By: Abigail Miyamoto M.D.  On: 05/18/2015 10:44     CT reviewed personally today and with patient.  Assessment & Plan:   50 year old male with incidental 2.2 left renal cell carcinoma status post uncomplicated left robotic partial nephrectomy 6/16.    1. 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  Follow-up surveillance CT without any evidence of recurrence.. - Urinalysis, Complete - Basic metabolic panel - CT Abdomen Pelvis W Wo Contrast; Future in 9 months, will space out imaging over time  2. Renal cyst, right Plan to follow up with surveillance imaging as above.     Return in about 9 months (around 02/21/2016) for f/u CT scan.  Hollice Espy, MD  [facility] [address] [place], [place] [address] [phone]   Patient encouraged to follow up  with PCP for other non-urologic related symptoms expressed today.

## 2015-06-05 ENCOUNTER — Other Ambulatory Visit: Payer: Commercial Managed Care - HMO

## 2015-06-05 ENCOUNTER — Ambulatory Visit: Payer: Commercial Managed Care - HMO | Admitting: Oncology

## 2015-08-14 ENCOUNTER — Emergency Department (HOSPITAL_COMMUNITY)
Admission: EM | Admit: 2015-08-14 | Discharge: 2015-08-14 | Disposition: A | Discharge status: Home / Self Care | Payer: Medicare HMO | Attending: Emergency Medicine | Admitting: Emergency Medicine

## 2015-08-14 ENCOUNTER — Encounter (HOSPITAL_COMMUNITY): Payer: Self-pay

## 2015-08-14 DIAGNOSIS — Z79899 Other long term (current) drug therapy: Secondary | ICD-10-CM | POA: Diagnosis not present

## 2015-08-14 DIAGNOSIS — I10 Essential (primary) hypertension: Secondary | ICD-10-CM | POA: Diagnosis not present

## 2015-08-14 DIAGNOSIS — R197 Diarrhea, unspecified: Secondary | ICD-10-CM | POA: Insufficient documentation

## 2015-08-14 DIAGNOSIS — E785 Hyperlipidemia, unspecified: Secondary | ICD-10-CM | POA: Diagnosis not present

## 2015-08-14 DIAGNOSIS — Z87891 Personal history of nicotine dependence: Secondary | ICD-10-CM | POA: Diagnosis not present

## 2015-08-14 LAB — COMPREHENSIVE METABOLIC PANEL
ALBUMIN: 5 g/dL (ref 3.5–5.0)
ALT: 29 U/L (ref 17–63)
AST: 19 U/L (ref 15–41)
Alkaline Phosphatase: 62 U/L (ref 38–126)
Anion gap: 11 (ref 5–15)
BILIRUBIN TOTAL: 1.1 mg/dL (ref 0.3–1.2)
BUN: 9 mg/dL (ref 6–20)
CO2: 25 mmol/L (ref 22–32)
Calcium: 10.5 mg/dL — ABNORMAL HIGH (ref 8.9–10.3)
Chloride: 101 mmol/L (ref 101–111)
Creatinine, Ser: 0.92 mg/dL (ref 0.61–1.24)
GFR calc Af Amer: >60 mL/min (ref >60)
GFR calc non Af Amer: >60 mL/min (ref >60)
GLUCOSE: 102 mg/dL — AB (ref 65–99)
POTASSIUM: 4.4 mmol/L (ref 3.5–5.1)
SODIUM: 137 mmol/L (ref 135–145)
TOTAL PROTEIN: 8.2 g/dL — AB (ref 6.5–8.1)

## 2015-08-14 LAB — URINALYSIS, ROUTINE W REFLEX MICROSCOPIC
BILIRUBIN URINE: NEGATIVE
GLUCOSE, UA: NEGATIVE mg/dL
HGB URINE DIPSTICK: NEGATIVE
Ketones, ur: NEGATIVE mg/dL
Leukocytes, UA: NEGATIVE
Nitrite: NEGATIVE
Protein, ur: NEGATIVE mg/dL
SPECIFIC GRAVITY, URINE: 1.01 (ref 1.005–1.030)
pH: 7 (ref 5.0–8.0)

## 2015-08-14 LAB — CBC WITH DIFFERENTIAL/PLATELET
BASOS ABS: 0.1 10*3/uL (ref 0.0–0.1)
Basophils Relative: 1 %
Eosinophils Absolute: 0.1 10*3/uL (ref 0.0–0.7)
Eosinophils Relative: 2 %
HCT: 44.9 % (ref 39.0–52.0)
Hemoglobin: 15.8 g/dL (ref 13.0–17.0)
LYMPHS ABS: 1.1 10*3/uL (ref 0.7–4.0)
LYMPHS PCT: 22 %
MCH: 28.7 pg (ref 26.0–34.0)
MCHC: 35.2 g/dL (ref 30.0–36.0)
MCV: 81.6 fL (ref 78.0–100.0)
MONO ABS: 0.5 10*3/uL (ref 0.1–1.0)
Monocytes Relative: 10 %
Neutro Abs: 3.3 10*3/uL (ref 1.7–7.7)
Neutrophils Relative %: 66 %
PLATELETS: 153 10*3/uL (ref 150–400)
RBC: 5.5 MIL/uL (ref 4.22–5.81)
RDW: 13.4 % (ref 11.5–15.5)
WBC: 5 10*3/uL (ref 4.0–10.5)

## 2015-08-14 LAB — LIPASE, BLOOD: LIPASE: 55 U/L — AB (ref 11–51)

## 2015-08-14 MED ORDER — DIPHENOXYLATE-ATROPINE 2.5-0.025 MG PO TABS
1.0000 | ORAL_TABLET | Freq: Four times a day (QID) | ORAL | Status: AC | PRN
Start: 2015-08-14 — End: ?

## 2015-08-14 MED ORDER — ONDANSETRON 8 MG PO TBDP: 8.0000 mg | ORAL_TABLET | Freq: Three times a day (TID) | ORAL | Status: AC | PRN

## 2015-08-14 MED ORDER — DIPHENOXYLATE-ATROPINE 2.5-0.025 MG PO TABS
2.0000 | ORAL_TABLET | Freq: Once | ORAL | Status: AC
  Administered 2015-08-14: 2 via ORAL
  Filled 2015-08-14: qty 2

## 2015-08-14 MED ORDER — PROCHLORPERAZINE EDISYLATE 5 MG/ML IJ SOLN
10.0000 mg | Freq: Once | INTRAMUSCULAR | Status: AC
  Administered 2015-08-14: 10 mg via INTRAVENOUS
  Filled 2015-08-14: qty 2

## 2015-08-14 MED ORDER — SODIUM CHLORIDE 0.9 % IV BOLUS (SEPSIS)
1000.0000 mL | Freq: Once | INTRAVENOUS | Status: AC
  Administered 2015-08-14: 1000 mL via INTRAVENOUS

## 2015-08-14 NOTE — ED Notes (Signed)
Pt reports feeling nauseated for over a week but reports started having diarrhea Tuesday after starting zoloft.

## 2015-08-14 NOTE — ED Provider Notes (Signed)
CSN: [id number]     Arrival date & time 08/14/15  0757 History   First MD Initiated Contact with Patient 08/14/15 [phone]     Chief Complaint  Patient presents with  . Diarrhea     (Consider location/radiation/quality/duration/timing/severity/associated sxs/prior Treatment) The history is provided by the patient.   Juan Jackson is a 50 y.o. male with a past medical history of  Renal cell carcinoma, hypertension, chronic liver disease with distant history of EtOH abuse, stating has been abstinent for 4 years presenting with a seven-day history of nausea without emesis and diarrhea.  He endorses clear watery diarrhea at least 3 times per day, more if he attempts to eat.  He has had no fevers or chills.  He endorses abdominal cramping which improves after bowel movements.  He reports feeling anxious and weak at the same time.  He has had no decrease in urinary frequency.  He has had no medications for diarrhea prior to arrival.  He reports his symptoms started within 24 hours after starting a new medication, Zoloft to help with anxiety.     Past Medical History  Diagnosis Date  . Hypertension   . Gout   . Liver disease, chronic   . ETOH abuse   . Lumbar back pain   . Peripheral neuropathy ([place])   . GERD (gastroesophageal reflux disease)   . Hyperlipidemia   . PONV (postoperative nausea and vomiting)   . Cancer of left kidney ([place]) 11/22/2014  . BP (high blood pressure) 11/17/2013  . Acid reflux 11/17/2013  . Gastro-esophageal reflux disease without esophagitis 01/12/2014  . Neuritis or radiculitis due to rupture of lumbar intervertebral disc 02/03/2014   Past Surgical History  Procedure Laterality Date  . Tonsillectomy    . Vasectomy Bilateral 1996  . Robotic assited partial nephrectomy Left 10/17/2014    Procedure: ROBOTIC ASSITED PARTIAL NEPHRECTOMY;  Surgeon: Hollice Espy, MD;  Location: [facility] ORS;  Service: Urology;  Laterality: Left;   Family History  Problem Relation Age of  Onset  . Cancer Mother   . Diabetes Mother   . Coronary artery disease Mother   . Coronary artery disease Father   . Hypertension Father   . Diabetes Father   . Diabetes Sister   . Diabetes Brother   . Cirrhosis Brother   . Diabetes Sister   . Thyroid disease Sister    Social History  Substance Use Topics  . Smoking status: Former Smoker    Quit date: 12/22/1991  . Smokeless tobacco: Never Used  . Alcohol Use: No    Review of Systems  Constitutional: Negative for fever and chills.  HENT: Negative for congestion and sore throat.   Eyes: Negative.   Respiratory: Negative for chest tightness and shortness of breath.   Cardiovascular: Negative for chest pain.  Gastrointestinal: Positive for nausea, abdominal pain and diarrhea. Negative for vomiting.  Genitourinary: Negative.   Musculoskeletal: Negative for joint swelling, arthralgias and neck pain.  Skin: Negative.  Negative for rash and wound.  Neurological: Positive for weakness. Negative for dizziness, light-headedness, numbness and headaches.  Psychiatric/Behavioral: The patient is nervous/anxious.       Allergies  Morphine and related  Home Medications   Prior to Admission medications   Medication Sig Start Date End Date Taking? Authorizing Provider  allopurinol (ZYLOPRIM) 300 MG tablet Take 300 mg by mouth every evening. Reported on 05/24/2015   Yes Historical Provider, MD  cholecalciferol (VITAMIN D) 1000 units tablet Take 1,000 Units by mouth  daily.   Yes Historical Provider, MD  lisinopril (PRINIVIL,ZESTRIL) 20 MG tablet Take 20 mg by mouth daily.   Yes Historical Provider, MD  meloxicam (MOBIC) 15 MG tablet Take 15 mg by mouth every morning.    Yes Historical Provider, MD  pantoprazole (PROTONIX) 40 MG tablet Take 40 mg by mouth 2 (two) times daily.    Yes Historical Provider, MD  pravastatin (PRAVACHOL) 10 MG tablet Take 10 mg by mouth at bedtime.  08/04/14  Yes Historical Provider, MD  sertraline (ZOLOFT) 50 MG  tablet Take 50 mg by mouth daily.   Yes Historical Provider, MD  diphenoxylate-atropine (LOMOTIL) 2.5-0.025 MG tablet Take 1 tablet by mouth 4 (four) times daily as needed for diarrhea or loose stools. 08/14/15   Evalee Jefferson, PA-C  ondansetron (ZOFRAN ODT) 8 MG disintegrating tablet Take 1 tablet (8 mg total) by mouth every 8 (eight) hours as needed for nausea or vomiting. 08/14/15   Evalee Jefferson, PA-C  tiZANidine (ZANAFLEX) 4 MG tablet Take 4 mg by mouth every 6 (six) hours as needed. sleep 08/07/15   Historical Provider, MD  traMADol (ULTRAM) 50 MG tablet 1-2 tablets every 6-8 hours as needed. 05/02/15   Historical Provider, MD  traMADol-acetaminophen Caroline Sauger) 37.5-325 MG per tablet Reported on 05/24/2015 11/04/14   Historical Provider, MD   BP 132/90 mmHg  Pulse 65  Temp(Src) 97.7 F (36.5 C) (Oral)  Resp 16  Ht 6\' 2"  (1.88 m)  Wt 108.863 kg  BMI 30.80 kg/m2  SpO2 100% Physical Exam  Constitutional: He appears well-developed and well-nourished.  HENT:  Head: Normocephalic and atraumatic.  Eyes: Conjunctivae are normal.  Neck: Normal range of motion.  Cardiovascular: Normal rate, regular rhythm, normal heart sounds and intact distal pulses.   Pulmonary/Chest: Effort normal and breath sounds normal. He has no wheezes.  Abdominal: Soft. Bowel sounds are normal. He exhibits no mass. There is tenderness. There is no rebound and no guarding.  Mild ttp right upper quadrant.   Musculoskeletal: Normal range of motion.  Neurological: He is alert.  Skin: Skin is warm and dry.  Psychiatric: He has a normal mood and affect.  Nursing note and vitals reviewed.   ED Course  Procedures (including critical care time) Labs Review Labs Reviewed  COMPREHENSIVE METABOLIC PANEL - Abnormal; Notable for the following:    Glucose, Bld 102 (*)    Calcium 10.5 (*)    Total Protein 8.2 (*)    All other components within normal limits  LIPASE, BLOOD - Abnormal; Notable for the following:    Lipase 55 (*)     All other components within normal limits  CBC WITH DIFFERENTIAL/PLATELET  URINALYSIS, ROUTINE W REFLEX MICROSCOPIC (NOT AT [facility])    Imaging Review No results found. I have personally reviewed and evaluated these images and lab results as part of my medical decision-making.   EKG Interpretation None      MDM   Final diagnoses:  Diarrhea, unspecified type    Labs reviewed and are stable.  Suspect patient's symptoms may be related to side effect from Zoloft as his symptoms started after starting this new medication.  Advised patient to discuss with PCP may want to switch to a different antidepressant.  He was prescribed Lomotil and Zofran for when necessary use for symptom relief in the interim.    Evalee Jefferson, PA-C 08/14/15 [place], MD 08/15/15 1553

## 2015-08-14 NOTE — ED Notes (Signed)
Given fluids.   

## 2015-08-14 NOTE — Discharge Instructions (Signed)
Diarrhea Diarrhea is frequent loose and watery bowel movements. It can cause you to feel weak and dehydrated. Dehydration can cause you to become tired and thirsty, have a dry mouth, and have decreased urination that often is dark yellow. Diarrhea is a sign of another problem, most often an infection that will not last long. In most cases, diarrhea typically lasts 2-3 days. However, it can last longer if it is a sign of something more serious. It is important to treat your diarrhea as directed by your caregiver to lessen or prevent future episodes of diarrhea. CAUSES  Some common causes include:  Gastrointestinal infections caused by viruses, bacteria, or parasites.  Food poisoning or food allergies.  Certain medicines, such as antibiotics, chemotherapy, and laxatives.  Artificial sweeteners and fructose.  Digestive disorders. HOME CARE INSTRUCTIONS  Ensure adequate fluid intake (hydration): Have 1 cup (8 oz) of fluid for each diarrhea episode. Avoid fluids that contain simple sugars or sports drinks, fruit juices, whole milk products, and sodas. Your urine should be clear or pale yellow if you are drinking enough fluids. Hydrate with an oral rehydration solution that you can purchase at pharmacies, retail stores, and online. You can prepare an oral rehydration solution at home by mixing the following ingredients together:   - tsp table salt.   tsp baking soda.   tsp salt substitute containing potassium chloride.  1  tablespoons sugar.  1 L (34 oz) of water.  Certain foods and beverages may increase the speed at which food moves through the gastrointestinal (GI) tract. These foods and beverages should be avoided and include:  Caffeinated and alcoholic beverages.  High-fiber foods, such as raw fruits and vegetables, nuts, seeds, and whole grain breads and cereals.  Foods and beverages sweetened with sugar alcohols, such as xylitol, sorbitol, and mannitol.  Some foods may be well  tolerated and may help thicken stool including:  Starchy foods, such as rice, toast, pasta, low-sugar cereal, oatmeal, grits, baked potatoes, crackers, and bagels.  Bananas.  Applesauce.  Add probiotic-rich foods to help increase healthy bacteria in the GI tract, such as yogurt and fermented milk products.  Wash your hands well after each diarrhea episode.  Only take over-the-counter or prescription medicines as directed by your caregiver.  Take a warm bath to relieve any burning or pain from frequent diarrhea episodes. SEEK IMMEDIATE MEDICAL CARE IF:   You are unable to keep fluids down.  You have persistent vomiting.  You have blood in your stool, or your stools are black and tarry.  You do not urinate in 6-8 hours, or there is only a small amount of very dark urine.  You have abdominal pain that increases or localizes.  You have weakness, dizziness, confusion, or light-headedness.  You have a severe headache.  Your diarrhea gets worse or does not get better.  You have a fever or persistent symptoms for more than 2-3 days.  You have a fever and your symptoms suddenly get worse. MAKE SURE YOU:   Understand these instructions.  Will watch your condition.  Will get help right away if you are not doing well or get worse.   This information is not intended to replace advice given to you by your health care provider. Make sure you discuss any questions you have with your health care provider.   Document Released: 03/29/2002 Document Revised: 04/29/2014 Document Reviewed: 12/15/2011 Elsevier Interactive Patient Education 2016 [place].  Diarrhea Diarrhea is frequent loose and watery bowel movements. It can  cause you to feel weak and dehydrated. Dehydration can cause you to become tired and thirsty, have a dry mouth, and have decreased urination that often is dark yellow. Diarrhea is a sign of another problem, most often an infection that will not last long. In most  cases, diarrhea typically lasts 2-3 days. However, it can last longer if it is a sign of something more serious. It is important to treat your diarrhea as directed by your caregiver to lessen or prevent future episodes of diarrhea. CAUSES  Some common causes include:  Gastrointestinal infections caused by viruses, bacteria, or parasites.  Food poisoning or food allergies.  Certain medicines, such as antibiotics, chemotherapy, and laxatives.  Artificial sweeteners and fructose.  Digestive disorders. HOME CARE INSTRUCTIONS  Ensure adequate fluid intake (hydration): Have 1 cup (8 oz) of fluid for each diarrhea episode. Avoid fluids that contain simple sugars or sports drinks, fruit juices, whole milk products, and sodas. Your urine should be clear or pale yellow if you are drinking enough fluids. Hydrate with an oral rehydration solution that you can purchase at pharmacies, retail stores, and online. You can prepare an oral rehydration solution at home by mixing the following ingredients together:   - tsp table salt.   tsp baking soda.   tsp salt substitute containing potassium chloride.  1  tablespoons sugar.  1 L (34 oz) of water.  Certain foods and beverages may increase the speed at which food moves through the gastrointestinal (GI) tract. These foods and beverages should be avoided and include:  Caffeinated and alcoholic beverages.  High-fiber foods, such as raw fruits and vegetables, nuts, seeds, and whole grain breads and cereals.  Foods and beverages sweetened with sugar alcohols, such as xylitol, sorbitol, and mannitol.  Some foods may be well tolerated and may help thicken stool including:  Starchy foods, such as rice, toast, pasta, low-sugar cereal, oatmeal, grits, baked potatoes, crackers, and bagels.  Bananas.  Applesauce.  Add probiotic-rich foods to help increase healthy bacteria in the GI tract, such as yogurt and fermented milk products.  Wash your hands  well after each diarrhea episode.  Only take over-the-counter or prescription medicines as directed by your caregiver.  Take a warm bath to relieve any burning or pain from frequent diarrhea episodes. SEEK IMMEDIATE MEDICAL CARE IF:   You are unable to keep fluids down.  You have persistent vomiting.  You have blood in your stool, or your stools are black and tarry.  You do not urinate in 6-8 hours, or there is only a small amount of very dark urine.  You have abdominal pain that increases or localizes.  You have weakness, dizziness, confusion, or light-headedness.  You have a severe headache.  Your diarrhea gets worse or does not get better.  You have a fever or persistent symptoms for more than 2-3 days.  You have a fever and your symptoms suddenly get worse. MAKE SURE YOU:   Understand these instructions.  Will watch your condition.  Will get help right away if you are not doing well or get worse.   This information is not intended to replace advice given to you by your health care provider. Make sure you discuss any questions you have with your health care provider.   Document Released: 03/29/2002 Document Revised: 04/29/2014 Document Reviewed: 12/15/2011 Elsevier Interactive Patient Education [employer].

## 2015-11-21 DEATH — deceased

## 2016-02-23 ENCOUNTER — Ambulatory Visit: Payer: Commercial Managed Care - HMO | Admitting: Urology
